# Patient Record
Sex: Male | Born: 1980 | Race: White | Hispanic: No | Marital: Married | Smoking: Current some day smoker
Health system: Southern US, Community
[De-identification: ages and names within clinical notes are randomized; demographics above are authoritative.]

## PROBLEM LIST (undated history)

## (undated) DIAGNOSIS — E785 Hyperlipidemia, unspecified: Secondary | ICD-10-CM

## (undated) DIAGNOSIS — K760 Fatty (change of) liver, not elsewhere classified: Secondary | ICD-10-CM

## (undated) DIAGNOSIS — R7303 Prediabetes: Secondary | ICD-10-CM

## (undated) HISTORY — DX: Fatty (change of) liver, not elsewhere classified: K76.0

## (undated) HISTORY — DX: Prediabetes: R73.03

---

## 2011-05-31 ENCOUNTER — Inpatient Hospital Stay (INDEPENDENT_AMBULATORY_CARE_PROVIDER_SITE_OTHER)
Admission: RE | Admit: 2011-05-31 | Discharge: 2011-05-31 | Disposition: A | Discharge status: Home / Self Care | Source: Ambulatory Visit | Attending: Emergency Medicine | Admitting: Emergency Medicine

## 2011-05-31 ENCOUNTER — Encounter: Payer: Self-pay | Admitting: Emergency Medicine

## 2011-05-31 DIAGNOSIS — J069 Acute upper respiratory infection, unspecified: Secondary | ICD-10-CM

## 2011-08-12 NOTE — Progress Notes (Signed)
Summary: COUGH W/ SINUS ISSUES, SOB...WSE (rm 3)   Vital Signs:  Patient Profile:   30 Years Old Male CC:      sinus pain/pressure, cough x 1 1/2 weeks Height:     77 inches Weight:      219 pounds O2 Sat:      97 % O2 treatment:    Room Air Temp:     98.6 degrees F oral Pulse rate:   53 / minute Resp:     12 per minute BP sitting:   109 / 67  (left arm) Cuff size:   regular  Vitals Entered By: Lajean Saver RN (May 31, 2011 12:43 PM)                  Updated Prior Medication List: No Medications Current Allergies: No known allergies History of Present Illness Chief Complaint: sinus pain/pressure, cough x 1 1/2 weeks History of Present Illness: 30 Years Old Male complains of onset of cold symptoms for 9 days.  Elija has been using Sudafed & Ibu which is helping a little bit.  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 No sore throat + cough No pleuritic pain No wheezing + nasal congestion + post-nasal drainage ++ L facial sinus pain/pressure No chest congestion No itchy/red eyes No earache No hemoptysis No SOB No chills/sweats No fever No nausea No vomiting No abdominal pain No diarrhea No skin rashes No fatigue No myalgias + headache   REVIEW OF SYSTEMS Constitutional Symptoms      Denies fever, chills, night sweats, weight loss, weight gain, and fatigue.  Eyes       Denies change in vision, eye pain, eye discharge, glasses, contact lenses, and eye surgery. Ear/Nose/Throat/Mouth       Complains of frequent runny nose and sinus problems.      Denies hearing loss/aids, change in hearing, ear pain, ear discharge, dizziness, frequent nose bleeds, sore throat, hoarseness, and tooth pain or bleeding.  Respiratory       Complains of productive cough.      Denies dry cough, wheezing, shortness of breath, asthma, bronchitis, and emphysema/COPD.  Cardiovascular       Denies murmurs,  chest pain, and tires easily with exhertion.    Gastrointestinal       Denies stomach pain, nausea/vomiting, diarrhea, constipation, blood in bowel movements, and indigestion. Genitourniary       Denies painful urination, kidney stones, and loss of urinary control. Neurological       Complains of headaches.      Denies paralysis, seizures, and fainting/blackouts. Musculoskeletal       Denies muscle pain, joint pain, joint stiffness, decreased range of motion, redness, swelling, muscle weakness, and gout.  Skin       Denies bruising, unusual mles/lumps or sores, and hair/skin or nail changes.  Psych       Denies mood changes, temper/anger issues, anxiety/stress, speech problems, depression, and sleep problems. Other Comments: Received flumist about 1 1/2 weeks ago, shortly afer developed productive cough, runny nose. Recently has developed severe sinus pain/HA on the left side.  Taken sudafed and Ibuprofen OTC   Past History:  Past Medical History: Unremarkable  Past Surgical History: Denies surgical history  Social History: Never Smoked Alcohol use-yes Drug use-no MilitarySmoking Status:  never Drug Use:  no Physical Exam General appearance: well developed, well nourished, no acute distress Head: maxillary sinus tenderness, Left Ears: normal,  no lesions or deformities Nasal: clear discharge Oral/Pharynx: clear PND, no erythema, no exudate Chest/Lungs: no rales, wheezes, or rhonchi bilateral, breath sounds equal without effort Heart: regular rate and  rhythm, no murmur MSE: oriented to time, place, and person Assessment New Problems: UPPER RESPIRATORY INFECTION, ACUTE (ICD-465.9)   Plan New Medications/Changes: AUGMENTIN 875-125 MG TABS (AMOXICILLIN-POT CLAVULANATE) 1 by mouth two times a day for 8 days  #16 x 0, 05/31/2011, Hoyt Koch MD  New Orders: New Patient Level II [phone] Planning Comments:   1)  Take the prescribed antibiotic as instructed. 2)  Use  nasal saline solution (over the counter) at least 3 times a day. 3)  Use over the counter decongestants like Zyrtec-D every 12 hours as needed to help with congestion. 4)  Can take tylenol every 6 hours or motrin every 8 hours for pain or fever. 5)  Follow up with your primary doctor  if no improvement in 5-7 days, sooner if increasing pain, fever, or new symptoms.    The patient and/or caregiver has been counseled thoroughly with regard to medications prescribed including dosage, schedule, interactions, rationale for use, and possible side effects and they verbalize understanding.  Diagnoses and expected course of recovery discussed and will return if not improved as expected or if the condition worsens. Patient and/or caregiver verbalized understanding.  Prescriptions: AUGMENTIN 875-125 MG TABS (AMOXICILLIN-POT CLAVULANATE) 1 by mouth two times a day for 8 days  #16 x 0   Entered and Authorized by:   Hoyt Koch MD   Signed by:   Hoyt Koch MD on 05/31/2011   Method used:   Print then Give to Patient   RxID:   [id number]   Orders Added: 1)  New Patient Level II [95621]

## 2012-06-20 ENCOUNTER — Emergency Department (INDEPENDENT_AMBULATORY_CARE_PROVIDER_SITE_OTHER)
Admission: EM | Admit: 2012-06-20 | Discharge: 2012-06-20 | Disposition: A | Discharge status: Home / Self Care | Source: Home / Self Care | Attending: Family Medicine | Admitting: Family Medicine

## 2012-06-20 DIAGNOSIS — J069 Acute upper respiratory infection, unspecified: Secondary | ICD-10-CM

## 2012-06-20 MED ORDER — BENZONATATE 200 MG PO CAPS: 200.0000 mg | ORAL_CAPSULE | Freq: Every day | ORAL | Status: DC

## 2012-06-20 MED ORDER — AZITHROMYCIN 250 MG PO TABS: ORAL_TABLET | ORAL | Status: DC

## 2012-06-20 NOTE — ED Provider Notes (Signed)
History     CSN: [id number]  Arrival date & time 06/20/12  1327   First MD Initiated Contact with Patient 06/20/12 1342      Chief Complaint  Patient presents with  . Cough    4 days     HPI Comments: Patient complains of approximately 4 day history of gradually progressive URI symptoms beginning with a mild sore throat (now improved), followed by progressive nasal congestion.  A cough started next.   Complains of fatigue but no myalgias.  Cough is now worse at night and generally non-productive during the day.  There has been no pleuritic pain, shortness of breath, or wheezes.   The history is provided by the patient.    History reviewed. No pertinent past medical history.  History reviewed. No pertinent past surgical history.  History reviewed. No pertinent family history.  History  Substance Use Topics  . Smoking status: Former Smoker -- 0.5 packs/day for 10 years  . Smokeless tobacco: Never Used  . Alcohol Use: Yes      Review of Systems + sore throat + cough No pleuritic pain No wheezing + nasal congestion + post-nasal drainage No sinus pain/pressure No itchy/red eyes No earache No hemoptysis No SOB No fever, + chills No nausea No vomiting No abdominal pain No diarrhea No urinary symptoms No skin rashes + fatigue No myalgias No headache Used OTC meds without relief  Allergies  Review of patient's allergies indicates no known allergies.  Home Medications   Current Outpatient Rx  Name Route Sig Dispense Refill  . PSEUDOEPHEDRINE-GUAIFENESIN ER 60-600 MG PO TB12 Oral Take 1 tablet by mouth every 12 (twelve) hours.    Marland Kitchen BENZONATATE 200 MG PO CAPS Oral Take 1 capsule (200 mg total) by mouth at bedtime. Take as needed for cough 12 capsule 0    BP 110/74  Pulse 70  Temp 98.2 F (36.8 C) (Oral)  Resp 16  Ht 6\' 3"  (1.905 m)  Wt 228 lb (103.42 kg)  BMI 28.50 kg/m2  SpO2 97%  Physical Exam Nursing notes and Vital Signs reviewed. Appearance:   Patient appears healthy, stated age, and in no acute distress Eyes:  Pupils are equal, round, and reactive to light and accomodation.  Extraocular movement is intact.  Conjunctivae are not inflamed  Ears:  Canals normal.  Tympanic membranes normal.  Nose:  Mildly congested turbinates.  No sinus tenderness.   Pharynx:  Normal Neck:  Supple.  Slightly tender shotty posterior nodes are palpated bilaterally  Lungs:  Clear to auscultation.  Breath sounds are equal.  Heart:  Regular rate and rhythm without murmurs, rubs, or gallops.  Abdomen:  Nontender without masses or hepatosplenomegaly.  Bowel sounds are present.  No CVA or flank tenderness.  Extremities:  No edema.  No calf tenderness Skin:  No rash present.   ED Course  Procedures none      1. Acute upper respiratory infections of unspecified site       MDM  There is no evidence of bacterial infection today.   Treat symptomatically for now:  Prescription written for Benzonatate [facility]) to take at bedtime for night-time cough.  Take Mucinex D (guaifenesin with decongestant) twice daily for congestion.  Increase fluid intake, rest. May use Afrin nasal spray (or generic oxymetazoline) twice daily for about 5 days.  Also recommend using saline nasal spray several times daily and saline nasal irrigation (AYR is a common brand) Stop all antihistamines for now, and other non-prescription cough/cold preparations.  May take Ibuprofen 200mg , 4 tabs every 8 hours with food for chest/sternum discomfort. Begin Azithromycin if not improving about 5 days or if persistent fever develops (Given a prescription to hold, with an expiration date)  Follow-up with family doctor if not improving 7 to 10 days.        Lattie Haw, MD 06/23/12 [phone]

## 2012-06-20 NOTE — ED Notes (Addendum)
Rembert complains of productive cough with green sputum for 4 days. He also complains of hoarseness and sore throat. Denies fever, chills or sweats.

## 2012-07-27 ENCOUNTER — Emergency Department (INDEPENDENT_AMBULATORY_CARE_PROVIDER_SITE_OTHER)
Admission: EM | Admit: 2012-07-27 | Discharge: 2012-07-27 | Disposition: A | Discharge status: Home / Self Care | Source: Home / Self Care

## 2012-07-27 ENCOUNTER — Encounter: Payer: Self-pay | Admitting: *Deleted

## 2012-07-27 DIAGNOSIS — S61219A Laceration without foreign body of unspecified finger without damage to nail, initial encounter: Secondary | ICD-10-CM

## 2012-07-27 DIAGNOSIS — S61209A Unspecified open wound of unspecified finger without damage to nail, initial encounter: Secondary | ICD-10-CM

## 2012-07-27 NOTE — ED Notes (Signed)
Patient reports he was sharpening his knife, his hand slipped causing a laceration to the base of his left index finger. Bleeding is slow. Site cleaned with Hibiclens.

## 2012-07-27 NOTE — ED Provider Notes (Signed)
History     CSN: [id number]  Arrival date & time 07/27/12  0907   First MD Initiated Contact with Patient 07/27/12 [phone]      No chief complaint on file.  HPI Comments: Pt accidentally cut his hand when sharpening his knife.   Patient is a 31 y.o. male presenting with skin laceration.  Laceration  The incident occurred less than 1 hour ago. The laceration is located on the left hand. The laceration is 2 cm in size. The laceration mechanism was a a metal edge. The pain is at a severity of 2/10. The pain is mild. He reports no foreign bodies present. His tetanus status is UTD.    History reviewed. No pertinent past medical history.  History reviewed. No pertinent past surgical history.  Family History  Problem Relation Age of Onset  . Cancer Father     prostate    History  Substance Use Topics  . Smoking status: Current Some Day Smoker -- 0.5 packs/day for 10 years  . Smokeless tobacco: Never Used  . Alcohol Use: Yes      Review of Systems  Allergies  Review of patient's allergies indicates no known allergies.  Home Medications   Current Outpatient Rx  Name  Route  Sig  Dispense  Refill  . AZITHROMYCIN 250 MG PO TABS      Take 2 tabs today; then begin one tab once daily for 4 more days. (Rx void after 06/28/12)   6 each   0   . BENZONATATE 200 MG PO CAPS   Oral   Take 1 capsule (200 mg total) by mouth at bedtime. Take as needed for cough   12 capsule   0   . PSEUDOEPHEDRINE-GUAIFENESIN ER 60-600 MG PO TB12   Oral   Take 1 tablet by mouth every 12 (twelve) hours.           BP 115/77  Pulse 59  Temp 98.2 F (36.8 C) (Oral)  Resp 14  Ht 6\' 5"  (1.956 m)  Wt 220 lb (99.791 kg)  BMI 26.09 kg/m2  SpO2 96%  Physical Exam  Constitutional: He appears well-developed and well-nourished.  HENT:  Head: Normocephalic and atraumatic.  Right Ear: External ear normal.  Left Ear: External ear normal.  Eyes: Conjunctivae normal are normal. Pupils are equal,  round, and reactive to light.  Neck: Normal range of motion. Neck supple.  Cardiovascular: Normal rate, regular rhythm and normal heart sounds.   Pulmonary/Chest: Effort normal and breath sounds normal.  Abdominal: Soft. Bowel sounds are normal.  Musculoskeletal:       Hands:      Superficial hand laceration, full ROM, neurovascularly intact distally   Neurological: He is alert.  Skin: Skin is warm.    ED Course  LACERATION REPAIR Performed by: Doree Albee Authorized by: Doree Albee Consent: Verbal consent obtained. Risks and benefits: risks, benefits and alternatives were discussed Body area: upper extremity Location details: left index finger Laceration length: 2 cm Foreign bodies: no foreign bodies Tendon involvement: none Nerve involvement: none Vascular damage: no Local anesthetic: lidocaine 2% without epinephrine Anesthetic total: 4.5 ml Preparation: Patient was prepped and draped in the usual sterile fashion. Irrigation solution: saline Amount of cleaning: standard Debridement: minimal Degree of undermining: none Skin closure: Ethilon Number of sutures: 5 Suture technique: interrupted  Dressing: 4x4 sterile gauze Patient tolerance: Patient tolerated the procedure well with no immediate complications.   (including critical care time)  Labs Reviewed - No data  to display No results found.   1. Finger laceration       MDM  Area copiously cleansed and irrigated at bedside.  Sutures placed.  Discussed infectious red flags.  Follow up in 5-7 days for suture removal.     The patient and/or caregiver has been counseled thoroughly with regard to treatment plan and/or medications prescribed including dosage, schedule, interactions, rationale for use, and possible side effects and they verbalize understanding. Diagnoses and expected course of recovery discussed and will return if not improved as expected or if the condition worsens. Patient and/or caregiver  verbalized understanding.             Doree Albee, MD 07/27/12 1032

## 2012-07-30 ENCOUNTER — Telehealth: Payer: Self-pay | Admitting: *Deleted

## 2012-10-31 ENCOUNTER — Encounter: Payer: Self-pay | Admitting: *Deleted

## 2012-10-31 ENCOUNTER — Emergency Department (INDEPENDENT_AMBULATORY_CARE_PROVIDER_SITE_OTHER)
Admission: EM | Admit: 2012-10-31 | Discharge: 2012-10-31 | Disposition: A | Discharge status: Home / Self Care | Source: Home / Self Care | Attending: Family Medicine | Admitting: Family Medicine

## 2012-10-31 DIAGNOSIS — J069 Acute upper respiratory infection, unspecified: Secondary | ICD-10-CM

## 2012-10-31 MED ORDER — AZITHROMYCIN 250 MG PO TABS: ORAL_TABLET | ORAL | Status: DC

## 2012-10-31 MED ORDER — BENZONATATE 200 MG PO CAPS: 200.0000 mg | ORAL_CAPSULE | Freq: Every day | ORAL | Status: DC

## 2012-10-31 NOTE — ED Provider Notes (Signed)
History     CSN: [id number]  Arrival date & time 10/31/12  1343   First MD Initiated Contact with Patient 10/31/12 1401      Chief Complaint  Patient presents with  . Nasal Congestion  . Cough  . Headache       HPI Comments: Pt complains of headache, rhinitis, and cough for 3 days. Has tried OTC Mucinex D with little relief.    The history is provided by the patient.    History reviewed. No pertinent past medical history.  History reviewed. No pertinent past surgical history.  Family History  Problem Relation Age of Onset  . Cancer Father     prostate    History  Substance Use Topics  . Smoking status: Current Some Day Smoker -- 0.50 packs/day for 10 years  . Smokeless tobacco: Never Used  . Alcohol Use: Yes      Review of Systems + sore throat + cough No pleuritic pain No wheezing + nasal congestion + post-nasal drainage No sinus pain/pressure No itchy/red eyes No earache No hemoptysis No SOB No fever/chills No nausea No vomiting No abdominal pain No diarrhea No urinary symptoms No skin rashes + fatigue No myalgias + headache Used OTC meds without relief  Allergies  Review of patient's allergies indicates no known allergies.  Home Medications   Current Outpatient Rx  Name  Route  Sig  Dispense  Refill  . azithromycin (ZITHROMAX Z-PAK) 250 MG tablet      Take 2 tabs today; then begin one tab once daily for 4 more days. (Rx void after 11/08/12)   6 each   0   . benzonatate (TESSALON) 200 MG capsule   Oral   Take 1 capsule (200 mg total) by mouth at bedtime. Take as needed for cough   12 capsule   0   . pseudoephedrine-guaifenesin (MUCINEX D) 60-600 MG per tablet   Oral   Take 1 tablet by mouth every 12 (twelve) hours.           BP 126/78  Pulse 85  Temp(Src) 97.6 F (36.4 C) (Oral)  Resp 16  Ht 6\' 5"  (1.956 m)  Wt 234 lb 8 oz (106.369 kg)  BMI 27.8 kg/m2  SpO2 97%  Physical Exam Nursing notes and Vital Signs  reviewed. Appearance:  Patient appears healthy, stated age, and in no acute distress Eyes:  Pupils are equal, round, and reactive to light and accomodation.  Extraocular movement is intact.  Conjunctivae are not inflamed  Ears:  Canals normal.  Tympanic membranes normal.  Nose:  Mildly congested turbinates.  No sinus tenderness.   Pharynx:  Normal Neck:  Supple.  Slightly tender shotty posterior nodes are palpated bilaterally  Lungs:  Clear to auscultation.  Breath sounds are equal.  Heart:  Regular rate and rhythm without murmurs, rubs, or gallops.  Abdomen:  Nontender without masses or hepatosplenomegaly.  Bowel sounds are present.  No CVA or flank tenderness.  Extremities:  No edema.  No calf tenderness Skin:  No rash present.   ED Course  Procedures  none      1. Acute upper respiratory infections of unspecified site; viral URI       MDM  There is no evidence of bacterial infection today.   Treat symptomatically for now  Prescription written for Benzonatate (Tessalon) to take at bedtime for night-time cough.  Take Mucinex D (guaifenesin with decongestant) twice daily for congestion.  Increase fluid intake, rest. May use Afrin  nasal spray (or generic oxymetazoline) twice daily for about 5 days.  Also recommend using saline nasal spray several times daily and saline nasal irrigation (AYR is a common brand) Stop all antihistamines for now, and other non-prescription cough/cold preparations. Begin Azithromycin if not improving about 5 days or if persistent fever develops (Given a prescription to hold, with an expiration date)  Follow-up with family doctor if not improving about 10 days.        Lattie Haw, MD 10/31/12 [phone]

## 2012-10-31 NOTE — ED Notes (Signed)
Pt c/o headache, rhinitis, cough x 3 days. Has tried OTC Mucinex d with little relief.

## 2013-08-30 ENCOUNTER — Encounter: Payer: Self-pay | Admitting: Emergency Medicine

## 2013-08-30 ENCOUNTER — Emergency Department (INDEPENDENT_AMBULATORY_CARE_PROVIDER_SITE_OTHER)
Admission: EM | Admit: 2013-08-30 | Discharge: 2013-08-30 | Disposition: A | Discharge status: Home / Self Care | Source: Home / Self Care | Attending: Family Medicine | Admitting: Family Medicine

## 2013-08-30 DIAGNOSIS — J069 Acute upper respiratory infection, unspecified: Secondary | ICD-10-CM

## 2013-08-30 MED ORDER — BENZONATATE 200 MG PO CAPS: 200.0000 mg | ORAL_CAPSULE | Freq: Every day | ORAL | Status: DC

## 2013-08-30 MED ORDER — AZITHROMYCIN 250 MG PO TABS: ORAL_TABLET | ORAL | Status: DC

## 2013-08-30 MED ORDER — PSEUDOEPHEDRINE-GUAIFENESIN ER 120-1200 MG PO TB12: 1.0000 | ORAL_TABLET | Freq: Two times a day (BID) | ORAL | Status: DC

## 2013-08-30 NOTE — ED Notes (Signed)
Pt c/o productive cough x 10 days. Denies fever.

## 2013-08-30 NOTE — ED Provider Notes (Signed)
CSN: [id number]     Arrival date & time 08/30/13  1947 History   First MD Initiated Contact with Patient 08/30/13 1955     Chief Complaint  Patient presents with  . Cough      HPI Comments: Patient complains of onset of scratchy throat and sinus congestion about 1.5 weeks ago, followed by a cough about 3 days later.  His cough is generally productive.  He has felt slightly dizzy at times over the past 2 days, but denies fevers, chills, and sweats.  No earache.  The history is provided by the patient.    History reviewed. No pertinent past medical history. History reviewed. No pertinent past surgical history. Family History  Problem Relation Age of Onset  . Cancer Father     prostate   History  Substance Use Topics  . Smoking status: Former Smoker -- 0.50 packs/day for 10 years  . Smokeless tobacco: Never Used  . Alcohol Use: Yes    Review of Systems + sore throat, resolved + cough No pleuritic pain No wheezing + nasal congestion + post-nasal drainage No sinus pain/pressure No itchy/red eyes No earache No hemoptysis No SOB No fever, + chills No nausea No vomiting No abdominal pain No diarrhea No urinary symptoms No skin rash + fatigue No myalgias No headache Used OTC meds without relief  Allergies  Review of patient's allergies indicates no known allergies.  Home Medications   Current Outpatient Rx  Name  Route  Sig  Dispense  Refill  . azithromycin (ZITHROMAX Z-PAK) 250 MG tablet      Take 2 tabs today; then begin one tab once daily for 4 more days. (Rx void after 09/07/13)   6 each   0   . benzonatate (TESSALON) 200 MG capsule   Oral   Take 1 capsule (200 mg total) by mouth at bedtime. Take as needed for cough   12 capsule   0   . Pseudoephedrine-Guaifenesin [id number] MG TB12   Oral   Take 1 tablet by mouth 2 (two) times daily.   20 each   1    BP 121/77  Pulse 72  Temp(Src) 97.8 F (36.6 C) (Oral)  Resp 18  Ht 6\' 5"  (1.956 m)  Wt 234  lb (106.142 kg)  BMI 27.74 kg/m2  SpO2 97% Physical Exam Nursing notes and Vital Signs reviewed. Appearance:  Patient appears healthy, stated age, and in no acute distress Eyes:  Pupils are equal, round, and reactive to light and accomodation.  Extraocular movement is intact.  Conjunctivae are not inflamed  Ears:  Canals normal.  Tympanic membranes normal.  Nose:  Mildly congested turbinates.  No sinus tenderness.   Pharynx:  Normal Neck:  Supple.  Nontender shotty posterior nodes are palpated bilaterally  Lungs:  Clear to auscultation.  Breath sounds are equal.  Heart:  Regular rate and rhythm without murmurs, rubs, or gallops.  Abdomen:  Nontender without masses or hepatosplenomegaly.  Bowel sounds are present.  No CVA or flank tenderness.  Extremities:  No edema.  No calf tenderness Skin:  No rash present.   ED Course  Procedures  none      MDM   1. Acute upper respiratory infections of unspecified site; suspect viral URI     There is no evidence of bacterial infection today.  Treat symptomatically for now: Prescription written for Benzonatate [facility]) to take at bedtime for night-time cough.  Take Mucinex D (1200mg  guaifenesin with decongestant) twice daily for congestion.  Increase fluid intake, rest. May use Afrin nasal spray (or generic oxymetazoline) twice daily for about 5 days.  Also recommend using saline nasal spray several times daily and saline nasal irrigation (AYR is a common brand) Try warm salt water gargles for sore throat.  Stop all antihistamines for now, and other non-prescription cough/cold preparations. Begin Azithromycin if not improving about one week or if persistent fever develops (Given a prescription to hold, with an expiration date)  Follow-up with family doctor if not improving about10 days.     Lattie Haw, MD 08/30/13 2019

## 2013-09-21 ENCOUNTER — Emergency Department (INDEPENDENT_AMBULATORY_CARE_PROVIDER_SITE_OTHER)
Admission: EM | Admit: 2013-09-21 | Discharge: 2013-09-21 | Disposition: A | Discharge status: Home / Self Care | Source: Home / Self Care | Attending: Emergency Medicine | Admitting: Emergency Medicine

## 2013-09-21 ENCOUNTER — Encounter: Payer: Self-pay | Admitting: Emergency Medicine

## 2013-09-21 DIAGNOSIS — J209 Acute bronchitis, unspecified: Secondary | ICD-10-CM

## 2013-09-21 MED ORDER — PROMETHAZINE-CODEINE 6.25-10 MG/5ML PO SYRP: ORAL_SOLUTION | ORAL | Status: DC

## 2013-09-21 MED ORDER — AZITHROMYCIN 250 MG PO TABS: ORAL_TABLET | ORAL | Status: DC

## 2013-09-21 MED ORDER — PREDNISONE (PAK) 10 MG PO TABS: ORAL_TABLET | ORAL | Status: DC

## 2013-09-21 NOTE — ED Notes (Signed)
Shayne c/o persistent cough, productive at times, green, without fever or SOB. Current smoker. Received flu vac this season.

## 2013-09-21 NOTE — ED Provider Notes (Signed)
CSN: [id number]     Arrival date & time 09/21/13  0909 History   First MD Initiated Contact with Patient 09/21/13 0913     Chief Complaint  Patient presents with  . Cough   (Consider location/radiation/quality/duration/timing/severity/associated sxs/prior Treatment) HPI Robert [name] c/o persistent cough, productive at times, green, without fever or SOB. Current smoker. Received flu vac this season.   Was seen here 08/30/13,  URI, and was also given a prescription for Z-Pak by physician here, which he did fill and completed    However, cough and congestion improved somewhat but never went away and now 3 days of worsening symptoms  URI HISTORY  Robert [name] is a 33 y.o. male who complains of onset of cold symptoms for since 08/30/13, worse x3 days   No chills/sweats +  Low-grade Fever,   No Nasal congestion No Discolored Post-nasal drainage No sinus pain/pressure No sore throat  +  Cough, which is hacking and persistent,  productive of green sputum No wheezing +chest congestion No hemoptysis No shortness of breath No pleuritic pain  No itchy/red eyes No earache  No nausea No vomiting No abdominal pain No diarrhea  No skin rashes +  Fatigue No myalgias No headache   History reviewed. No pertinent past medical history. History reviewed. No pertinent past surgical history. Family History  Problem Relation Age of Onset  . Cancer Father     prostate   History  Substance Use Topics  . Smoking status: Current Every Day Smoker -- 0.50 packs/day for 10 years  . Smokeless tobacco: Never Used  . Alcohol Use: Yes    Review of Systems  All other systems reviewed and are negative.    Allergies  Review of patient's allergies indicates no known allergies.  Home Medications   Current Outpatient Rx  Name  Route  Sig  Dispense  Refill  . azithromycin (ZITHROMAX Z-PAK) 250 MG tablet      Take 2 tablets on day one, then 1 tablet daily on days 2 through 5   1 each   0   .  predniSONE (STERAPRED UNI-PAK) 10 MG tablet      Take as directed for 6 days.--Take 6 on day 1, 5 on day 2, 4 on day 3, then 3 tablets on day 4, then 2 tablets on day 5, then 1 on day 6.   21 tablet   0   . promethazine-codeine (PHENERGAN WITH CODEINE) 6.25-10 MG/5ML syrup      Take 1-2 teaspoons every 4-6 hours as needed for cough. May cause drowsiness.   120 mL   0    BP 119/81  Pulse 79  Temp(Src) 97.7 F (36.5 C) (Oral)  Resp 16  Wt 235 lb (106.595 kg)  SpO2 98% Physical Exam  Nursing note and vitals reviewed. Constitutional: He is oriented to person, place, and time. He appears well-developed and well-nourished.  Non-toxic appearance. No distress.  Cough noted  HENT:  Head: Normocephalic and atraumatic.  Right Ear: Tympanic membrane normal.  Left Ear: Tympanic membrane normal.  Nose: Nose normal.  Mouth/Throat: Oropharynx is clear and moist. No oropharyngeal exudate.  Eyes: Right eye exhibits no discharge. Left eye exhibits no discharge. No scleral icterus.  Neck: Neck supple.  Cardiovascular: Normal rate, regular rhythm and normal heart sounds.   Pulmonary/Chest: No respiratory distress. He has no decreased breath sounds. He has rhonchi. He has no rales.  A few anterior rhonchi. Upon forced expiration, slight wheeze heard anteriorly, no other wheezes heard.  Hacking cough noted  Lymphadenopathy:    He has no cervical adenopathy.  Neurological: He is alert and oriented to person, place, and time.  Skin: Skin is warm and dry.    ED Course  Procedures (including critical care time) Labs Review Labs Reviewed - No data to display Imaging Review No results found.  EKG Interpretation    Date/Time:    Ventricular Rate:    PR Interval:    QRS Duration:   QT Interval:    QTC Calculation:   R Axis:     Text Interpretation:              MDM   1. Acute bronchitis with bronchospasm    Treatment options discussed, as well as risks, benefits,  alternatives. Patient voiced understanding and agreement with the following plans:  Z-Pak Prednisone burst Promethazine with codeine cough syrup Other symptomatic care Follow-up with your primary care doctor in 5-7 days if not improving, or sooner if symptoms become worse.  Precautions discussed. Red flags discussed. Questions invited and answered. Patient voiced understanding and agreement.     Jacqulyn Cane, MD 09/21/13 [phone]

## 2013-11-10 ENCOUNTER — Emergency Department (INDEPENDENT_AMBULATORY_CARE_PROVIDER_SITE_OTHER)
Admission: EM | Admit: 2013-11-10 | Discharge: 2013-11-10 | Disposition: A | Discharge status: Home / Self Care | Source: Home / Self Care | Attending: Family Medicine | Admitting: Family Medicine

## 2013-11-10 ENCOUNTER — Encounter: Payer: Self-pay | Admitting: Emergency Medicine

## 2013-11-10 DIAGNOSIS — J069 Acute upper respiratory infection, unspecified: Secondary | ICD-10-CM

## 2013-11-10 DIAGNOSIS — J029 Acute pharyngitis, unspecified: Secondary | ICD-10-CM

## 2013-11-10 LAB — POCT RAPID STREP A (OFFICE): RAPID STREP A SCREEN: NEGATIVE

## 2013-11-10 MED ORDER — AZITHROMYCIN 250 MG PO TABS: ORAL_TABLET | ORAL | Status: DC

## 2013-11-10 MED ORDER — BENZONATATE 200 MG PO CAPS: 200.0000 mg | ORAL_CAPSULE | Freq: Every day | ORAL | Status: DC

## 2013-11-10 MED ORDER — PREDNISONE 20 MG PO TABS: 20.0000 mg | ORAL_TABLET | Freq: Two times a day (BID) | ORAL | Status: DC

## 2013-11-10 NOTE — Discharge Instructions (Signed)
Take plain Mucinex (1200 mg guaifenesin) twice daily for cough and congestion.  May add Sudafed for sinus congestion.   Increase fluid intake, rest. May use Afrin nasal spray (or generic oxymetazoline) twice daily for about 5 days.  Also recommend using saline nasal spray several times daily and saline nasal irrigation (AYR is a common brand) Try warm salt water gargles for sore throat.  Stop all antihistamines for now, and other non-prescription cough/cold preparations. May take Tylenol for fever, headache, etc. Begin Azithromycin if not improving about 5 days or if persistent fever develops   Follow-up with family doctor if not improving 7 to 10 days.

## 2013-11-10 NOTE — ED Notes (Signed)
Robert [name] c/o sore throat, sinus burning and congestion without fever x 5 days. Rec'd flu vac this season.

## 2013-11-10 NOTE — ED Provider Notes (Signed)
CSN: [id number]     Arrival date & time 11/10/13  1424 History   First MD Initiated Contact with Patient 11/10/13 [phone]     Chief Complaint  Patient presents with  . Sore Throat  . Facial Pain      HPI Comments: Patient developed a very mild sore throat four days ago.  Two days ago he developed sinus congestion and a mild cough  The history is provided by the patient.    History reviewed. No pertinent past medical history. History reviewed. No pertinent past surgical history. Family History  Problem Relation Age of Onset  . Cancer Father     prostate   History  Substance Use Topics  . Smoking status: Current Every Day Smoker -- 0.50 packs/day for 10 years  . Smokeless tobacco: Never Used  . Alcohol Use: Yes    Review of Systems + sore throat + cough No pleuritic pain No wheezing + nasal congestion No post-nasal drainage No sinus pain/pressure No itchy/red eyes No earache No hemoptysis No SOB No fever/chills No nausea No vomiting No abdominal pain No diarrhea No urinary symptoms No skin rash No fatigue No myalgias No headache Used OTC meds without relief  Allergies  Review of patient's allergies indicates no known allergies.  Home Medications   Current Outpatient Rx  Name  Route  Sig  Dispense  Refill  . azithromycin (ZITHROMAX Z-PAK) 250 MG tablet      Take 2 tabs today; then begin one tab once daily for 4 more days. (Rx void after 11/18/13)   6 each   0   . benzonatate (TESSALON) 200 MG capsule   Oral   Take 1 capsule (200 mg total) by mouth at bedtime. Take as needed for cough   12 capsule   0   . predniSONE (DELTASONE) 20 MG tablet   Oral   Take 1 tablet (20 mg total) by mouth 2 (two) times daily. Take with food.   10 tablet   0    BP 111/73  Pulse 89  Temp(Src) 98 F (36.7 C) (Oral)  Resp 16  Wt 229 lb (103.874 kg)  SpO2 98% Physical Exam Nursing notes and Vital Signs reviewed. Appearance:  Patient appears stated age, and in no  acute distress Eyes:  Pupils are equal, round, and reactive to light and accomodation.  Extraocular movement is intact.  Conjunctivae are not inflamed  Ears:  Canals normal.  Tympanic membranes normal.  Nose:  Mildly congested turbinates.  No sinus tenderness.   Pharynx:   Minimal erythema Neck:  Supple.  Shotty non-tender posterior nodes are palpated bilaterally  Lungs:  Clear to auscultation.  Breath sounds are equal.  Heart:  Regular rate and rhythm without murmurs, rubs, or gallops.  Abdomen:  Nontender without masses or hepatosplenomegaly.  Bowel sounds are present.  No CVA or flank tenderness.  Extremities:  No edema.  No calf tenderness Skin:  No rash present.   ED Course  Procedures  none    Labs Reviewed  STREP A DNA PROBE  POCT RAPID STREP A (OFFICE) negative        MDM   1. Acute pharyngitis   2. Acute upper respiratory infections of unspecified site; suspect viral URI    Throat culture pending There is no evidence of bacterial infection today.  Treat symptomatically for now  Prescription written for Benzonatate (Tessalon) to take at bedtime for night-time cough.  Take plain Mucinex (1200 mg guaifenesin) twice daily for cough  and congestion.  May add Sudafed for sinus congestion.   Increase fluid intake, rest. May use Afrin nasal spray (or generic oxymetazoline) twice daily for about 5 days.  Also recommend using saline nasal spray several times daily and saline nasal irrigation (AYR is a common brand) Try warm salt water gargles for sore throat.  Stop all antihistamines for now, and other non-prescription cough/cold preparations. May take Tylenol for fever, headache, etc. Begin Azithromycin if not improving about 5 days or if persistent fever develops (Given a prescription to hold, with an expiration date)  Follow-up with family doctor if not improving 7 to 10 days.     Kandra Nicolas, MD 11/11/13 [phone]

## 2013-11-11 LAB — STREP A DNA PROBE: GASP: NEGATIVE

## 2014-01-27 ENCOUNTER — Encounter: Payer: Self-pay | Admitting: Emergency Medicine

## 2014-01-27 ENCOUNTER — Emergency Department (INDEPENDENT_AMBULATORY_CARE_PROVIDER_SITE_OTHER)

## 2014-01-27 ENCOUNTER — Emergency Department (INDEPENDENT_AMBULATORY_CARE_PROVIDER_SITE_OTHER)
Admission: EM | Admit: 2014-01-27 | Discharge: 2014-01-27 | Disposition: A | Discharge status: Home / Self Care | Source: Home / Self Care | Attending: Emergency Medicine | Admitting: Emergency Medicine

## 2014-01-27 DIAGNOSIS — R0789 Other chest pain: Secondary | ICD-10-CM

## 2014-01-27 DIAGNOSIS — R079 Chest pain, unspecified: Secondary | ICD-10-CM

## 2014-01-27 DIAGNOSIS — Z87891 Personal history of nicotine dependence: Secondary | ICD-10-CM

## 2014-01-27 DIAGNOSIS — J449 Chronic obstructive pulmonary disease, unspecified: Secondary | ICD-10-CM

## 2014-01-27 DIAGNOSIS — J4489 Other specified chronic obstructive pulmonary disease: Secondary | ICD-10-CM

## 2014-01-27 NOTE — ED Provider Notes (Signed)
CSN: [id number]     Arrival date & time 01/27/14  1136 History   First MD Initiated Contact with Patient 01/27/14 1147     Chief Complaint  Patient presents with  . Chest Pain  Robert [name] c/o left sided CP intermittent x 2 and 1/2 weeks. Denies any associated symptoms when CP occurs, such as numbness, SOB, HA, diaphoresis, jaw pain. Denis any current pain. Denies recent illness or strenuous activity. Negative for family hx of heart disease.   Patient is a 33 y.o. male presenting with chest pain. The history is provided by the patient.  Chest Pain Pain location:  L chest, L lateral chest and R lateral chest Pain quality: dull   Pain radiates to:  Does not radiate Pain radiates to the back: no   Pain severity now: Usually has 3/10- CURRENTLY, NO CHEST PAIN OR SXS. Onset quality:  Unable to specify (Only occurs at rest. He never gets chest pain when he does his usual 2 mile run) Duration:  5 minutes (Then resolves spontaneously) Timing:  Intermittent (Only gets this at rest) Progression:  Waxing and waning Chronicity:  New (Then, he mentions he complained of this when he went to the [facility] in January, EKG was normal) Context: at rest and stress (increased stress at work. He works as a Designer, industrial/product for [employer])   Context: not breathing, not eating, not lifting, no movement and not raising an arm   Relieved by: Nothing, except resolves on its own after about 5 minutes. Worsened by:  Nothing tried Associated symptoms: anxiety and headache ( occasional headache in the past, immediately resolved after drinking  cup of coffee)   Associated symptoms: no abdominal pain, no altered mental status, no anorexia, no back pain, no claudication, no cough, no dizziness, no dysphagia, no fever, no heartburn, no lower extremity edema, no nausea, no numbness, no orthopnea, no palpitations, no PND, no shortness of breath, no syncope, not vomiting and no weakness   Risk  factors: smoking   Risk factors: no aortic disease, no coronary artery disease, no diabetes mellitus, no high cholesterol, no hypertension, not obese, no prior DVT/PE and no surgery    Denies any reflux symptoms. No belching. No dysphasia.  History reviewed. No pertinent past medical history. History reviewed. No pertinent past surgical history. Family History  Problem Relation Age of Onset  . Cancer Father     prostate   History  Substance Use Topics  . Smoking status: Current Every Day Smoker -- 0.50 packs/day for 10 years  . Smokeless tobacco: Never Used  . Alcohol Use: Yes    Review of Systems  Constitutional: Negative for fever.  HENT: Negative for trouble swallowing.   Respiratory: Negative for cough and shortness of breath.   Cardiovascular: Positive for chest pain. Negative for palpitations, orthopnea, claudication, syncope and PND.  Gastrointestinal: Negative for heartburn, nausea, vomiting, abdominal pain and anorexia.  Musculoskeletal: Negative for back pain.  Neurological: Positive for headaches ( occasional headache in the past, immediately resolved after drinking  cup of coffee). Negative for dizziness, weakness and numbness.    Allergies  Review of patient's allergies indicates no known allergies.  Home Medications    BP 126/76  Pulse 64  Temp(Src) 98 F (36.7 C) (Oral)  Resp 14  Wt 233 lb (105.688 kg)  SpO2 98% Physical Exam  Nursing note and vitals reviewed. Constitutional: He is oriented to person, place, and time. He appears well-developed and well-nourished. No distress.  HENT:  Head: Normocephalic and atraumatic.  Nose: Nose normal.  Mouth/Throat: Oropharynx is clear and moist.  Eyes: Conjunctivae and EOM are normal. Pupils are equal, round, and reactive to light. No scleral icterus.  Neck: Normal range of motion. Neck supple. No JVD present. No tracheal deviation present. No thyromegaly present.  Cardiovascular: Normal rate, regular rhythm,  normal heart sounds and intact distal pulses.  Exam reveals no gallop and no friction rub.   No murmur heard. Pulmonary/Chest: Effort normal and breath sounds normal. No respiratory distress. He has no wheezes. He has no rales. He exhibits no tenderness.  Abdominal: Soft. He exhibits no distension. There is no tenderness.  Musculoskeletal: Normal range of motion. He exhibits no edema and no tenderness.  Lymphadenopathy:    He has no cervical adenopathy.  Neurological: He is alert and oriented to person, place, and time. No cranial nerve deficit. He exhibits normal muscle tone.  Skin: Skin is warm. No rash noted. He is not diaphoretic.  Psychiatric: He has a normal mood and affect.    ED Course  ED EKG  Date/Time: 01/27/2014 12:22 PM Performed by: Burnett Harry, Hanna Authorized by: Burnett Harry, Michell Previous ECG: no previous ECG available Rhythm comments: Sinus bradycardia, rate 56.--- Manual pulse 64 Ectopy comments: None QRS axis: right (Normal for age and body habitus) Conduction: conduction normal ST Segments: ST segments normal T Waves: T waves normal Other findings: early repolarization Other findings comments: Benign early repolarization in inferior leads, EKG immediately repeated and early repolarization not present with repeat EKG Clinical impression: normal ECG   (including critical care time) Labs Review Labs Reviewed - No data to display  Imaging Review Dg Chest 2 View  01/27/2014   CLINICAL DATA:  Left-sided upper chest pain, smoking history  EXAM: CHEST  2 VIEW  COMPARISON:  None.  FINDINGS: Hyperinflation, mild. Heart size and vascular pattern normal. Lungs clear. No effusion or pneumothorax. Osseous thorax intact.  IMPRESSION: Mild COPD.  No acute abnormalities.   Electronically Signed   By: Skipper Cliche M.D.   On: 01/27/2014 13:15     MDM   1. Atypical chest pain   2. Chest pain    EKG within normal limits.--See details above. Chest x-ray reviewed by me. No acute  abnormalities seen.  Currently not having chest pain. No evidence of any cardiorespiratory cause. Of note, symptoms are only at rest and never with exertion, and he runs 2 miles regularly without chest pain. No symptoms suggestive of esophageal/GI cause. Stress may be a factor.  We discussed options at length. Advised to quit smoking.  Treatment options discussed, as well as risks, benefits, alternatives. Patient voiced understanding and agreement with the following plans: No treatment at the present time, as he is asymptomatic. At the end of the visit, he tells me that he has an appointment at the [facility] on 02/02/2014 to evaluate with a stress test. I advised him to go ahead and keep that appointment.  Precautions discussed. Red flags discussed.--- Go to emergency room if any red flags Questions invited and answered. Patient voiced understanding and agreement.    Jacqulyn Cane, MD 01/27/14 [phone]

## 2014-01-27 NOTE — ED Notes (Signed)
Robert [name] c/o left sided CP intermittent x 2/1/2 weeks. Denies any associated symptoms when CP occurs, such as numbness, SOB, HA, diaphoresis, jaw pain. Denis any current pain. Denies recent illness or strenuous activity. Negative for family hx of heart disease.

## 2014-08-06 ENCOUNTER — Emergency Department (INDEPENDENT_AMBULATORY_CARE_PROVIDER_SITE_OTHER)
Admission: EM | Admit: 2014-08-06 | Discharge: 2014-08-06 | Disposition: A | Discharge status: Home / Self Care | Source: Home / Self Care | Attending: Emergency Medicine | Admitting: Emergency Medicine

## 2014-08-06 DIAGNOSIS — R1030 Lower abdominal pain, unspecified: Secondary | ICD-10-CM

## 2014-08-06 LAB — POCT CBC W AUTO DIFF (K'VILLE URGENT CARE)

## 2014-08-06 LAB — POCT URINALYSIS DIP (MANUAL ENTRY)
BILIRUBIN UA: NEGATIVE
Bilirubin, UA: NEGATIVE
GLUCOSE UA: NEGATIVE
Leukocytes, UA: NEGATIVE
Nitrite, UA: NEGATIVE
Protein Ur, POC: NEGATIVE
RBC UA: NEGATIVE
UROBILINOGEN UA: 0.2
pH, UA: 5.5

## 2014-08-06 NOTE — ED Notes (Signed)
Patient c/o Nausea and Vomiting, abdominal pain, lower back pain , denies fever and diarrhea, sx started 2-3 days ago

## 2014-08-06 NOTE — Discharge Instructions (Signed)

## 2014-08-06 NOTE — ED Provider Notes (Signed)
CSN: [id number]     Arrival date & time 08/06/14  1239 History   First MD Initiated Contact with Patient 08/06/14 1320     Chief Complaint  Patient presents with  . Abdominal Pain   (Consider location/radiation/quality/duration/timing/severity/associated sxs/prior Treatment) Patient is a 33 y.o. male presenting with abdominal pain. The history is provided by the patient. No language interpreter was used.  Abdominal Pain Pain location:  Generalized Pain quality: aching   Pain radiates to:  Does not radiate Pain severity:  Moderate Onset quality:  Gradual Duration:  3 days Timing:  Constant Progression:  Worsening Chronicity:  New Context: recent illness   Relieved by:  Nothing Worsened by:  Nothing tried Ineffective treatments:  None tried Associated symptoms: nausea and vomiting   Associated symptoms: no dysuria and no hematuria   Risk factors: has not had multiple surgeries   Pt reports he began feeling sick Thursday, nauseated yesterday and vomitted,  Uncomfortable and could not sleep last pm.  Pt reports decreased appetite today.   No past medical history on file. No past surgical history on file. Family History  Problem Relation Age of Onset  . Cancer Father     prostate   History  Substance Use Topics  . Smoking status: Current Every Day Smoker -- 0.50 packs/day for 10 years  . Smokeless tobacco: Never Used  . Alcohol Use: Yes    Review of Systems  Gastrointestinal: Positive for nausea, vomiting and abdominal pain.  Genitourinary: Negative for dysuria and hematuria.  All other systems reviewed and are negative.   Allergies  Review of patient's allergies indicates no known allergies.  Home Medications   Prior to Admission medications   Not on File   BP 122/79 mmHg  Pulse 65  Temp(Src) 97.6 F (36.4 C) (Oral)  Ht 6\' 5"  (1.956 m)  Wt 217 lb (98.431 kg)  BMI 25.73 kg/m2  SpO2 98% Physical Exam  Constitutional: He is oriented to person, place, and  time. He appears well-developed and well-nourished.  HENT:  Head: Normocephalic and atraumatic.  Eyes: EOM are normal. Pupils are equal, round, and reactive to light.  Neck: Normal range of motion.  Cardiovascular: Normal rate and normal heart sounds.   Pulmonary/Chest: Effort normal.  Abdominal: Soft. He exhibits no distension.  Musculoskeletal: Normal range of motion.  Neurological: He is alert and oriented to person, place, and time.  Skin: Skin is warm.  Psychiatric: He has a normal mood and affect.  Nursing note and vitals reviewed.   ED Course  Procedures (including critical care time) Labs Review Labs Reviewed  POCT URINALYSIS DIP (MANUAL ENTRY) - Normal  POCT CBC W AUTO DIFF ([facility]) - Normal    Imaging Review No results found.   MDM I counseled patient on results he has a normal white blood cell count urine is negative there is no blood. Patient is currently afebrile. Patient advised I'm concerned due to abdominal pain, nausea and anorexia. Patient feels like symptoms are improving. I discussed the option off going to the emergency department for CT scan. Patient reports he wants to see if symptoms continue to improve. I advised patient if symptoms have not resolved within 12 hours he needs to go to the emergency department for CT scan. I explained to patient signs and symptoms are of appendicitis and progression of illness. He agrees if symptoms worsen or change he will go to the emergency department immediately. He will recheck at [facility] [place] in 4  hours if symptoms have not resolved.    1. Lower abdominal pain    AVS    Fransico Meadow, PA-C 08/06/14 1430

## 2014-08-08 ENCOUNTER — Telehealth: Payer: Self-pay | Admitting: Emergency Medicine

## 2014-08-30 ENCOUNTER — Emergency Department (INDEPENDENT_AMBULATORY_CARE_PROVIDER_SITE_OTHER)
Admission: EM | Admit: 2014-08-30 | Discharge: 2014-08-30 | Disposition: A | Discharge status: Home / Self Care | Source: Home / Self Care | Attending: Family Medicine | Admitting: Family Medicine

## 2014-08-30 ENCOUNTER — Encounter: Payer: Self-pay | Admitting: *Deleted

## 2014-08-30 DIAGNOSIS — L739 Follicular disorder, unspecified: Secondary | ICD-10-CM

## 2014-08-30 MED ORDER — TRIAMCINOLONE ACETONIDE 0.1 % EX CREA: 1.0000 "application " | TOPICAL_CREAM | Freq: Two times a day (BID) | CUTANEOUS | Status: DC

## 2014-08-30 MED ORDER — DOXYCYCLINE HYCLATE 100 MG PO CAPS: 100.0000 mg | ORAL_CAPSULE | Freq: Two times a day (BID) | ORAL | Status: DC

## 2014-08-30 NOTE — Discharge Instructions (Signed)
Minimize baths/showers.  Use a mild bath soap containing oil such as unscented Dove.  Apply medicated cream immediately after bathing.  Use clean socks daily.     Folliculitis  Folliculitis is redness, soreness, and swelling (inflammation) of the hair follicles. This condition can occur anywhere on the body. People with weakened immune systems, diabetes, or obesity have a greater risk of getting folliculitis. CAUSES  Bacterial infection. This is the most common cause.  Fungal infection.  Viral infection.  Contact with certain chemicals, especially oils and tars. Long-term folliculitis can result from bacteria that live in the nostrils. The bacteria may trigger multiple outbreaks of folliculitis over time. SYMPTOMS Folliculitis most commonly occurs on the scalp, thighs, legs, back, buttocks, and areas where hair is shaved frequently. An early sign of folliculitis is a small, white or yellow, pus-filled, itchy lesion (pustule). These lesions appear on a red, inflamed follicle. They are usually less than 0.2 inches (5 mm) wide. When there is an infection of the follicle that goes deeper, it becomes a boil or furuncle. A group of closely packed boils creates a larger lesion (carbuncle). Carbuncles tend to occur in hairy, sweaty areas of the body. DIAGNOSIS  Your caregiver can usually tell what is wrong by doing a physical exam. A sample may be taken from one of the lesions and tested in a lab. This can help determine what is causing your folliculitis. TREATMENT  Treatment may include:  Applying warm compresses to the affected areas.  Taking antibiotic medicines orally or applying them to the skin.  Draining the lesions if they contain a large amount of pus or fluid.  Laser hair removal for cases of long-lasting folliculitis. This helps to prevent regrowth of the hair. HOME CARE INSTRUCTIONS  Apply warm compresses to the affected areas as directed by your caregiver.  If antibiotics are  prescribed, take them as directed. Finish them even if you start to feel better.  You may take over-the-counter medicines to relieve itching.  Do not shave irritated skin.  Follow up with your caregiver as directed. SEEK IMMEDIATE MEDICAL CARE IF:   You have increasing redness, swelling, or pain in the affected area.  You have a fever. MAKE SURE YOU:  Understand these instructions.  Will watch your condition.  Will get help right away if you are not doing well or get worse. Document Released: 11/04/2001 Document Revised: 02/25/2012 Document Reviewed: 11/26/2011 [facility] Patient Information 2015 [place], [place]. This information is not intended to replace advice given to you by your health care provider. Make sure you discuss any questions you have with your health care provider.

## 2014-08-30 NOTE — ED Provider Notes (Signed)
CSN: [id number]     Arrival date & time 08/30/14  1751 History   First MD Initiated Contact with Patient 08/30/14 [phone]     Chief Complaint  Patient presents with  . Rash     HPI Comments: Patient complains of a one month history of pruritic rash on his lower legs.  Patient is a 33 y.o. male presenting with rash. The history is provided by the patient.  Rash Location: both lower legs. Quality comment:  Itching Severity:  Moderate Onset quality:  Gradual Duration:  1 month Timing:  Constant Progression:  Worsening Chronicity:  New Context: not animal contact, not chemical exposure, not exposure to similar rash, not hot tub use, not insect bite/sting, not medications, not new detergent/soap and not plant contact   Relieved by:  Nothing Worsened by:  Nothing tried Ineffective treatments: witch hazel and hydrogen peroxide. Associated symptoms: no fatigue, no fever and no induration     History reviewed. No pertinent past medical history. History reviewed. No pertinent past surgical history. Family History  Problem Relation Age of Onset  . Cancer Father     prostate   History  Substance Use Topics  . Smoking status: Current Every Day Smoker -- 0.50 packs/day for 10 years  . Smokeless tobacco: Never Used  . Alcohol Use: Yes    Review of Systems  Constitutional: Negative for fever and fatigue.  Skin: Positive for rash.  All other systems reviewed and are negative.   Allergies  Review of patient's allergies indicates no known allergies.  Home Medications   Prior to Admission medications   Medication Sig Start Date End Date Taking? Authorizing Provider  doxycycline (VIBRAMYCIN) 100 MG capsule Take 1 capsule (100 mg total) by mouth 2 (two) times daily. Take with food. 08/30/14   Kandra Nicolas, MD  triamcinolone cream (KENALOG) 0.1 % Apply 1 application topically 2 (two) times daily. Apply thin film to lower legs. 08/30/14   Kandra Nicolas, MD   BP 133/77 mmHg  Pulse  92  Temp(Src) 97.9 F (36.6 C) (Oral)  Resp 14  Wt 226 lb (102.513 kg)  SpO2 99% Physical Exam  Constitutional: He is oriented to person, place, and time. He appears well-developed and well-nourished. No distress.  HENT:  Head: Normocephalic.  Eyes: Conjunctivae are normal. Pupils are equal, round, and reactive to light.  Neurological: He is alert and oriented to person, place, and time.  Skin: Skin is warm and dry. Rash noted. Rash is not vesicular.     Skin below the knees has numerous small erythematous excoriations centered on hair follicles.  No pustules, swelling, tenderness, or drainage.  Nursing note and vitals reviewed.   ED Course  Procedures       MDM   1. Folliculitis    Begin doxycycline for staph coverage.  Triamcinolone 0.1% cream BID   Minimize baths/showers.  Use a mild bath soap containing oil such as unscented Dove.  Apply medicated cream immediately after bathing.  Use clean socks daily.   Followup with dermatologist if not resolved 2 weeks.    Kandra Nicolas, MD 09/05/14 1018

## 2014-08-30 NOTE — ED Notes (Signed)
Pt c/o rash to lower legs x 1 month. Used peroxide, witch hazel, and neosporin x 1 month. No new socks or boots.

## 2015-01-09 ENCOUNTER — Emergency Department
Admission: EM | Admit: 2015-01-09 | Discharge: 2015-01-09 | Discharge status: Absent without leave | Source: Home / Self Care

## 2015-01-09 ENCOUNTER — Encounter: Payer: Self-pay | Admitting: Emergency Medicine

## 2015-01-09 ENCOUNTER — Emergency Department (INDEPENDENT_AMBULATORY_CARE_PROVIDER_SITE_OTHER)
Admission: EM | Admit: 2015-01-09 | Discharge: 2015-01-09 | Disposition: A | Discharge status: Home / Self Care | Source: Home / Self Care | Attending: Family Medicine | Admitting: Family Medicine

## 2015-01-09 DIAGNOSIS — J209 Acute bronchitis, unspecified: Secondary | ICD-10-CM | POA: Diagnosis not present

## 2015-01-09 MED ORDER — AZITHROMYCIN 250 MG PO TABS: ORAL_TABLET | ORAL | Status: DC

## 2015-01-09 NOTE — Discharge Instructions (Signed)
Take plain guaifenesin (1200mg  extended release tabs such as Mucinex) twice daily, with plenty of water, for cough and congestion.  May add Pseudoephedrine (30mg , one or two every 4 to 6 hours) for sinus congestion, if desired.  Get adequate rest.   Stop all antihistamines for now, and other non-prescription cough/cold preparations. May take Delsym Cough Suppressant at bedtime for nighttime cough.    Follow-up with family doctor if not improving about10 days.

## 2015-01-09 NOTE — ED Notes (Signed)
Productive cough x 3-4 weeks, light green mucus

## 2015-01-09 NOTE — ED Provider Notes (Signed)
CSN: [id number]     Arrival date & time 01/09/15  1253 History   First MD Initiated Contact with Patient 01/09/15 1303     Chief Complaint  Patient presents with  . Cough      HPI Comments: Patient reports that he developed a cold-like illness about one month ago with initial sore throat, sinus congestion, and cough.  The cough has persisted and has become somewhat productive.  No pleuritic pain, and no fevers, chills, and sweats. Patient reports that he has quit smoking.  The history is provided by the patient.    History reviewed. No pertinent past medical history. History reviewed. No pertinent past surgical history. Family History  Problem Relation Age of Onset  . Cancer Father     prostate   History  Substance Use Topics  . Smoking status: Current Every Day Smoker -- 0.50 packs/day for 10 years  . Smokeless tobacco: Never Used  . Alcohol Use: Yes    Review of Systems No sore throat + cough No pleuritic pain No wheezing No nasal congestion No post-nasal drainage No sinus pain/pressure No itchy/red eyes No earache No hemoptysis No SOB No fever/chills No nausea No vomiting No abdominal pain No diarrhea No urinary symptoms No skin rash No fatigue No myalgias No headache Used OTC meds without relief  Allergies  Review of patient's allergies indicates not on file.  Home Medications   Prior to Admission medications   Medication Sig Start Date End Date Taking? Authorizing Provider  azithromycin (ZITHROMAX Z-PAK) 250 MG tablet Take 2 tabs today; then begin one tab once daily for 4 more days. 01/09/15   Kandra Nicolas, MD  triamcinolone cream (KENALOG) 0.1 % Apply 1 application topically 2 (two) times daily. Apply thin film to lower legs. 08/30/14   Kandra Nicolas, MD   BP 131/80 mmHg  Pulse 78  Temp(Src) 98.3 F (36.8 C) (Oral)  Ht 6\' 5"  (1.956 m)  Wt 232 lb (105.235 kg)  BMI 27.51 kg/m2  SpO2 97% Physical Exam Nursing notes and Vital Signs  reviewed. Appearance:  Patient appears stated age, and in no acute distress Eyes:  Pupils are equal, round, and reactive to light and accomodation.  Extraocular movement is intact.  Conjunctivae are not inflamed  Ears:  Canals normal.  Tympanic membranes normal.  Nose:   Normal turbinates.  No sinus tenderness.    Pharynx:  Normal Neck:  Supple.  No adenopathy  Lungs:  Clear to auscultation.  Breath sounds are equal.  Heart:  Regular rate and rhythm without murmurs, rubs, or gallops.  Abdomen:  Nontender without masses or hepatosplenomegaly.  Bowel sounds are present.  No CVA or flank tenderness.  Extremities:  No edema.  No calf tenderness Skin:  No rash present.   ED Course  Procedures  none   MDM   1. Acute bronchitis, unspecified organism    Begin Z-pack for atypical coverage. Take plain guaifenesin (1200mg  extended release tabs such as Mucinex) twice daily, with plenty of water, for cough and congestion.  May add Pseudoephedrine (30mg , one or two every 4 to 6 hours) for sinus congestion, if desired.  Get adequate rest.   Stop all antihistamines for now, and other non-prescription cough/cold preparations. May take Delsym Cough Suppressant at bedtime for nighttime cough.    Follow-up with family doctor if not improving about10 days.     Kandra Nicolas, MD 01/09/15 1328

## 2015-01-13 ENCOUNTER — Telehealth: Payer: Self-pay | Admitting: Emergency Medicine

## 2015-06-11 ENCOUNTER — Emergency Department (INDEPENDENT_AMBULATORY_CARE_PROVIDER_SITE_OTHER)
Admission: EM | Admit: 2015-06-11 | Discharge: 2015-06-11 | Disposition: A | Discharge status: Home / Self Care | Source: Home / Self Care | Attending: Family Medicine | Admitting: Family Medicine

## 2015-06-11 ENCOUNTER — Encounter: Payer: Self-pay | Admitting: Emergency Medicine

## 2015-06-11 ENCOUNTER — Emergency Department (INDEPENDENT_AMBULATORY_CARE_PROVIDER_SITE_OTHER)

## 2015-06-11 DIAGNOSIS — M542 Cervicalgia: Secondary | ICD-10-CM | POA: Diagnosis not present

## 2015-06-11 DIAGNOSIS — S134XXA Sprain of ligaments of cervical spine, initial encounter: Secondary | ICD-10-CM

## 2015-06-11 DIAGNOSIS — S139XXA Sprain of joints and ligaments of unspecified parts of neck, initial encounter: Secondary | ICD-10-CM

## 2015-06-11 MED ORDER — PREDNISONE 50 MG PO TABS: ORAL_TABLET | ORAL | Status: DC

## 2015-06-11 MED ORDER — CYCLOBENZAPRINE HCL 10 MG PO TABS: ORAL_TABLET | ORAL | Status: DC

## 2015-06-11 NOTE — ED Notes (Signed)
Pt c/o possible pinched nerve in neck.  Pain is radiating down to both hands.  No apparent injury.

## 2015-06-11 NOTE — Discharge Instructions (Signed)
Apply ice pack for 20 to 30 minutes, 3 to 4 times daily  Continue until pain decreases.  Begin neck range of motion and stretching exercises as tolerated.   Cervical Sprain A cervical sprain is an injury in the neck in which the strong, fibrous tissues (ligaments) that connect your neck bones stretch or tear. Cervical sprains can range from mild to severe. Severe cervical sprains can cause the neck vertebrae to be unstable. This can lead to damage of the spinal cord and can result in serious nervous system problems. The amount of time it takes for a cervical sprain to get better depends on the cause and extent of the injury. Most cervical sprains heal in 1 to 3 weeks. CAUSES  Severe cervical sprains may be caused by:   Contact sport injuries (such as from football, rugby, wrestling, hockey, auto racing, gymnastics, diving, martial arts, or boxing).   Motor vehicle collisions.   Whiplash injuries. This is an injury from a sudden forward and backward whipping movement of the head and neck.  Falls.  Mild cervical sprains may be caused by:   Being in an awkward position, such as while cradling a telephone between your ear and shoulder.   Sitting in a chair that does not offer proper support.   Working at a poorly Landscape architect station.   Looking up or down for long periods of time.  SYMPTOMS   Pain, soreness, stiffness, or a burning sensation in the front, back, or sides of the neck. This discomfort may develop immediately after the injury or slowly, 24 hours or more after the injury.   Pain or tenderness directly in the middle of the back of the neck.   Shoulder or upper back pain.   Limited ability to move the neck.   Headache.   Dizziness.   Weakness, numbness, or tingling in the hands or arms.   Muscle spasms.   Difficulty swallowing or chewing.   Tenderness and swelling of the neck.  DIAGNOSIS  Most of the time your health care provider can  diagnose a cervical sprain by taking your history and doing a physical exam. Your health care provider will ask about previous neck injuries and any known neck problems, such as arthritis in the neck. X-rays may be taken to find out if there are any other problems, such as with the bones of the neck. Other tests, such as a CT scan or MRI, may also be needed.  TREATMENT  Treatment depends on the severity of the cervical sprain. Mild sprains can be treated with rest, keeping the neck in place (immobilization), and pain medicines. Severe cervical sprains are immediately immobilized. Further treatment is done to help with pain, muscle spasms, and other symptoms and may include:  Medicines, such as pain relievers, numbing medicines, or muscle relaxants.   Physical therapy. This may involve stretching exercises, strengthening exercises, and posture training. Exercises and improved posture can help stabilize the neck, strengthen muscles, and help stop symptoms from returning.  HOME CARE INSTRUCTIONS   Put ice on the injured area.   Put ice in a plastic bag.   Place a towel between your skin and the bag.   Leave the ice on for 15-20 minutes, 3-4 times a day.   If your injury was severe, you may have been given a cervical collar to wear. A cervical collar is a two-piece collar designed to keep your neck from moving while it heals.  Do not remove the collar unless instructed  by your health care provider.  If you have long hair, keep it outside of the collar.  Ask your health care provider before making any adjustments to your collar. Minor adjustments may be required over time to improve comfort and reduce pressure on your chin or on the back of your head.  Ifyou are allowed to remove the collar for cleaning or bathing, follow your health care provider's instructions on how to do so safely.  Keep your collar clean by wiping it with mild soap and water and drying it completely. If the collar  you have been given includes removable pads, remove them every 1-2 days and hand wash them with soap and water. Allow them to air dry. They should be completely dry before you wear them in the collar.  If you are allowed to remove the collar for cleaning and bathing, wash and dry the skin of your neck. Check your skin for irritation or sores. If you see any, tell your health care provider.  Do not drive while wearing the collar.   Only take over-the-counter or prescription medicines for pain, discomfort, or fever as directed by your health care provider.   Keep all follow-up appointments as directed by your health care provider.   Keep all physical therapy appointments as directed by your health care provider.   Make any needed adjustments to your workstation to promote good posture.   Avoid positions and activities that make your symptoms worse.   Warm up and stretch before being active to help prevent problems.  SEEK MEDICAL CARE IF:   Your pain is not controlled with medicine.   You are unable to decrease your pain medicine over time as planned.   Your activity level is not improving as expected.  SEEK IMMEDIATE MEDICAL CARE IF:   You develop any bleeding.  You develop stomach upset.  You have signs of an allergic reaction to your medicine.   Your symptoms get worse.   You develop new, unexplained symptoms.   You have numbness, tingling, weakness, or paralysis in any part of your body.  MAKE SURE YOU:   Understand these instructions.  Will watch your condition.  Will get help right away if you are not doing well or get worse. Document Released: 06/23/2007 Document Revised: 08/31/2013 Document Reviewed: 03/03/2013 [facility] Patient Information 2015 [place], [place]. This information is not intended to replace advice given to you by your health care provider. Make sure you discuss any questions you have with your health care provider.

## 2015-06-11 NOTE — ED Provider Notes (Signed)
CSN: [id number]     Arrival date & time 06/11/15  1150 History   First MD Initiated Contact with Patient 06/11/15 1347     Chief Complaint  Patient presents with  . Neck Pain     HPI Comments: Patient complains of onset yesterday of bilateral posterior neck pain with intermittent tingling radiation into the 4th and 5th fingers of each hand. He recalls no injury.  However, he is in the [employer] and about 1.5 weeks ago was called up for duty in [place] where he was wearing body armor that weighted 40 to 50 pounds.  He returned home 5 days ago.   Patient is a 34 y.o. male presenting with neck injury. The history is provided by the patient.  Neck Injury This is a new problem. The current episode started yesterday. The problem occurs constantly. The problem has not changed since onset.Associated symptoms comments: Tingling sensation in fingers. Exacerbated by: neck movement. The symptoms are relieved by NSAIDs. He has tried nothing for the symptoms.    History reviewed. No pertinent past medical history. History reviewed. No pertinent past surgical history. Family History  Problem Relation Age of Onset  . Cancer Father     prostate   Social History  Substance Use Topics  . Smoking status: Current Every Day Smoker -- 0.50 packs/day for 10 years  . Smokeless tobacco: Never Used  . Alcohol Use: Yes    Review of Systems  All other systems reviewed and are negative.   Allergies  Review of patient's allergies indicates no known allergies.  Home Medications   Prior to Admission medications   Medication Sig Start Date End Date Taking? Authorizing Provider  cyclobenzaprine (FLEXERIL) 10 MG tablet Take one tab by mouth at bedtime for muscle spasm 06/11/15   Kandra Nicolas, MD  predniSONE (DELTASONE) 50 MG tablet Take one tab by mouth with food once daily for five days 06/11/15   Kandra Nicolas, MD   Meds Ordered and Administered this Visit  Medications - No data to display  BP  119/77 mmHg  Pulse 60  Temp(Src) 98.3 F (36.8 C) (Oral)  Ht 6\' 3"  (1.905 m)  Wt 238 lb (107.956 kg)  BMI 29.75 kg/m2  SpO2 98% No data found.   Physical Exam  Constitutional: He is oriented to person, place, and time. He appears well-developed and well-nourished. No distress.  HENT:  Head: Normocephalic.  Right Ear: External ear normal.  Left Ear: External ear normal.  Nose: Nose normal.  Mouth/Throat: Oropharynx is clear and moist.  Eyes: Conjunctivae are normal. Pupils are equal, round, and reactive to light.  Neck: Neck supple. Muscular tenderness present. Decreased range of motion present.    Neck has decreased range of motion in all planes.  There is bilateral tenderness to palpation over the sternocleidomastoid muscles and trapezius muscles as noted on diagram. Distal neurovascular function is intact.     Cardiovascular: Normal heart sounds.   Pulmonary/Chest: Breath sounds normal.  Abdominal: There is no tenderness.  Musculoskeletal: He exhibits no edema or tenderness.  Lymphadenopathy:    He has no cervical adenopathy.  Neurological: He is alert and oriented to person, place, and time. No cranial nerve deficit. Coordination normal.  Skin: Skin is warm and dry. No rash noted.  Nursing note and vitals reviewed.   ED Course  Procedures  None    Imaging Review Dg Cervical Spine Complete  06/11/2015   CLINICAL DATA:  Posterior neck pain since Friday.  No known injury.  EXAM: CERVICAL SPINE  4+ VIEWS  COMPARISON:  None.  FINDINGS: There is no evidence of cervical spine fracture or prevertebral soft tissue swelling. Alignment is normal. No other significant bone abnormalities are identified. Loss of the normal cervical lordosis with straightening.  IMPRESSION: No acute osseous abnormality of the cervical spine. Loss of the normal cervical lordosis with straightening as can be seen with spasm.   Electronically Signed   By: Kathreen Devoid   On: 06/11/2015 14:34     MDM    1. Cervical sprain, initial encounter    Begin prednisone burst.  Flexeril 10mg  HS Apply ice pack for 20 to 30 minutes, 3 to 4 times daily  Continue until pain decreases.  Begin neck range of motion and stretching exercises as tolerated. Followup with Dr. Aundria Mems or Dr. Lynne Leader ([facility]) if not improving about one week or if paresthesias persist/recur.    Kandra Nicolas, MD 06/13/15 1006

## 2015-06-18 ENCOUNTER — Emergency Department (INDEPENDENT_AMBULATORY_CARE_PROVIDER_SITE_OTHER)
Admission: EM | Admit: 2015-06-18 | Discharge: 2015-06-18 | Disposition: A | Discharge status: Home / Self Care | Source: Home / Self Care | Attending: Emergency Medicine | Admitting: Emergency Medicine

## 2015-06-18 DIAGNOSIS — M542 Cervicalgia: Secondary | ICD-10-CM

## 2015-06-18 NOTE — ED Provider Notes (Signed)
CSN: [id number]     Arrival date & time 06/18/15  1341 History   First MD Initiated Contact with Patient 06/18/15 1354     Chief Complaint  Patient presents with  . Neck Pain   (Consider location/radiation/quality/duration/timing/severity/associated sxs/prior Treatment) HPI Comments: Pt needs a note to return to his regular job.  Pt was seen with neck pain on 10/2.  Pt reports pain has complexly resolved and he wants to do his regular job but he needs a note to return.  Pt reports no pain. No current symptoms  Patient is a 34 y.o. male presenting with neck pain. The history is provided by the patient. No language interpreter was used.  Neck Pain   History reviewed. No pertinent past medical history. History reviewed. No pertinent past surgical history. Family History  Problem Relation Age of Onset  . Cancer Father     prostate   Social History  Substance Use Topics  . Smoking status: Current Every Day Smoker -- 0.50 packs/day for 10 years  . Smokeless tobacco: Never Used  . Alcohol Use: Yes    Review of Systems  Musculoskeletal: Positive for neck pain.  All other systems reviewed and are negative.   Allergies  Review of patient's allergies indicates no known allergies.  Home Medications   Prior to Admission medications   Medication Sig Start Date End Date Taking? Authorizing Provider  cyclobenzaprine (FLEXERIL) 10 MG tablet Take one tab by mouth at bedtime for muscle spasm 06/11/15   Kandra Nicolas, MD  predniSONE (DELTASONE) 50 MG tablet Take one tab by mouth with food once daily for five days 06/11/15   Kandra Nicolas, MD   Meds Ordered and Administered this Visit  Medications - No data to display  There were no vitals taken for this visit. No data found.   Physical Exam  Constitutional: He is oriented to person, place, and time. He appears well-developed and well-nourished.  HENT:  Head: Normocephalic and atraumatic.  Eyes: Conjunctivae and EOM are normal.  Pupils are equal, round, and reactive to light.  Neck: Normal range of motion.  Cardiovascular: Normal rate.   Pulmonary/Chest: Effort normal.  Abdominal: Soft. He exhibits no distension.  Musculoskeletal: Normal range of motion. He exhibits no tenderness.  Neurological: He is alert and oriented to person, place, and time.  Skin: Skin is warm.  Psychiatric: He has a normal mood and affect.  Nursing note and vitals reviewed.   ED Course  Procedures (including critical care time)  Labs Review Labs Reviewed - No data to display  Imaging Review No results found.   Visual Acuity Review  Right Eye Distance:   Left Eye Distance:   Bilateral Distance:    Right Eye Near:   Left Eye Near:    Bilateral Near:         MDM   1. Neck pain       Fransico Meadow, [place] 06/18/15 1417

## 2015-06-18 NOTE — ED Notes (Signed)
Patient was seen 06/11/15 placed on light duty, he states he is better and his job needs him to be re-evaluated. He needs a return to work note.

## 2015-07-13 ENCOUNTER — Ambulatory Visit (INDEPENDENT_AMBULATORY_CARE_PROVIDER_SITE_OTHER): Admitting: Family Medicine

## 2015-07-13 ENCOUNTER — Encounter: Payer: Self-pay | Admitting: Family Medicine

## 2015-07-13 VITALS — BP 146/84 | HR 80 | Ht 76.0 in | Wt 241.0 lb

## 2015-07-13 DIAGNOSIS — M542 Cervicalgia: Secondary | ICD-10-CM

## 2015-07-13 NOTE — Progress Notes (Signed)
   Subjective:    I'm seeing this patient as a consultation for:  Dr Assunta Found  CC: Neck pain  HPI: Patient was seen in early October for cervical pain radiating to both ulnar hands. He was treated with prednisone and Flexeril and relative rest. Additionally he was restricted from work. 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 In the interim he has done well and is completely asymptomatic. However he needs a note to return to work full duties. His job requires him to distend his life potentially physically.  He feels that he is able to do his job duties fully. He is not taking any medications currently.  Past medical history, Surgical history, Family history not pertinant except as noted below, Social history, Allergies, and medications have been entered into the medical record, reviewed, and no changes needed.   Review of Systems: No headache, visual changes, nausea, vomiting, diarrhea, constipation, dizziness, abdominal pain, skin rash, fevers, chills, night sweats, weight loss, swollen lymph nodes, body aches, joint swelling, muscle aches, chest pain, shortness of breath, mood changes, visual or auditory hallucinations.   Objective:    Filed Vitals:   07/13/15 0820  BP: 146/84  Pulse: 80   General: Well Developed, well nourished, and in no acute distress.  Neuro/Psych: Alert and oriented x3, extra-ocular muscles intact, able to move all 4 extremities, sensation grossly intact. Skin: Warm and dry, no rashes noted.  Respiratory: Not using accessory muscles, speaking in full sentences, trachea midline.  Cardiovascular: Pulses palpable, no extremity edema. Abdomen: Does not appear distended. MSK: Neck: Nontender throughout. Normal neck range of motion. Negative Spurling's test. Upper extremity strength is intact and equal throughout. Sensation is intact throughout. Capillary refill are intact distally bilateral upper extremities  No  results found for this or any previous visit (from the past 24 hour(s)). No results found.  Impression and Recommendations:    Assessment and plan: 34 year old man with resolved neck pain with cervical radiculopathy. Patient is much improved and I feel is reasonable for him to return to work full duties. I recommend patient return in the near future to recheck blood pressure and perform wellness visit.  This case required medical decision making of moderate complexity.

## 2015-07-13 NOTE — Patient Instructions (Signed)
Thank you for coming in today. Come back or go to the emergency room if you notice new weakness new numbness problems walking or bowel or bladder problems. Return for wellness visit in the future.

## 2016-02-06 ENCOUNTER — Ambulatory Visit (INDEPENDENT_AMBULATORY_CARE_PROVIDER_SITE_OTHER)

## 2016-02-06 ENCOUNTER — Encounter: Payer: Self-pay | Admitting: Family Medicine

## 2016-02-06 ENCOUNTER — Ambulatory Visit (INDEPENDENT_AMBULATORY_CARE_PROVIDER_SITE_OTHER): Admitting: Family Medicine

## 2016-02-06 VITALS — BP 120/63 | HR 60 | Wt 237.0 lb

## 2016-02-06 DIAGNOSIS — M545 Low back pain, unspecified: Secondary | ICD-10-CM

## 2016-02-06 DIAGNOSIS — R1032 Left lower quadrant pain: Secondary | ICD-10-CM | POA: Diagnosis not present

## 2016-02-06 DIAGNOSIS — I861 Scrotal varices: Secondary | ICD-10-CM | POA: Diagnosis not present

## 2016-02-06 DIAGNOSIS — M4317 Spondylolisthesis, lumbosacral region: Secondary | ICD-10-CM | POA: Diagnosis not present

## 2016-02-06 DIAGNOSIS — M549 Dorsalgia, unspecified: Secondary | ICD-10-CM | POA: Insufficient documentation

## 2016-02-06 DIAGNOSIS — M419 Scoliosis, unspecified: Secondary | ICD-10-CM | POA: Diagnosis not present

## 2016-02-06 LAB — CBC
HEMATOCRIT: 44.2 % (ref 38.5–50.0)
Hemoglobin: 15.3 g/dL (ref 13.2–17.1)
MCH: 30.8 pg (ref 27.0–33.0)
MCHC: 34.6 g/dL (ref 32.0–36.0)
MCV: 89.1 fL (ref 80.0–100.0)
MPV: 10.7 fL (ref 7.5–12.5)
Platelets: 138 10*3/uL — ABNORMAL LOW (ref 140–400)
RBC: 4.96 MIL/uL (ref 4.20–5.80)
RDW: 13.2 % (ref 11.0–15.0)
WBC: 5.5 10*3/uL (ref 3.8–10.8)

## 2016-02-06 MED ORDER — POLYETHYLENE GLYCOL 3350 17 GM/SCOOP PO POWD: 17.0000 g | Freq: Every day | ORAL | Status: DC

## 2016-02-06 MED ORDER — CYCLOBENZAPRINE HCL 10 MG PO TABS: 10.0000 mg | ORAL_TABLET | Freq: Every day | ORAL | Status: DC

## 2016-02-06 NOTE — Progress Notes (Signed)
       Robert [name] is a 35 y.o. male who presents to [facility]: [facility] today for new left lower quadrant abdominal pain and new right low back pain.  1) left lower quadrant abdominal pain: This is been present now for about 2 months. It occurred without injury. Pain is nonradiating and mild and sharp in nature. Pain is intermittent and does not seem to be associated with activity or food. He denies any fevers or chills vomiting or diarrhea. He does not feel particularly constipated. He denies any urinary symptoms. He does note a history of left scrotum varicocele/hydrocele that has been evaluated at the [place] and found to be not malignant. He denies any pain radiating to his scrotum.  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 It's intermittent in nature and mild to moderate. Symptoms are worse in the morning when he gets up and improves throughout the day. He denies any radiating pain weakness or numbness. He typically takes ibuprofen for pain which helps a lot. He denies any specific injury.   No past medical history on file. No past surgical history on file. Social History  Substance Use Topics  . Smoking status: Current Every Day Smoker -- 0.50 packs/day for 10 years  . Smokeless tobacco: Never Used  . Alcohol Use: Yes   family history includes Cancer in his father.  ROS as above:  Medications: Current Outpatient Prescriptions  Medication Sig Dispense Refill  . cyclobenzaprine (FLEXERIL) 10 MG tablet Take 1 tablet (10 mg total) by mouth at bedtime. 30 tablet 0  . polyethylene glycol powder (GLYCOLAX/MIRALAX) powder Take 17 g by mouth daily. 850 g 1   No current facility-administered medications for this visit.   No Known Allergies   Exam:  BP 120/63 mmHg  Pulse 60  Wt 237 lb (107.502 kg)    Gen: Well NAD HEENT: EOMI,  MMM Lungs: Normal work of breathing. CTABL Heart: RRR no MRG Abd: NABS, Soft. Nondistended, Mildly tender left lower quadrant with no guarding or rebound. No masses palpated Groin: No inguinal lymphadenopathy. Right testicle is nontender with no masses or herniation. Left testicle was varicocele/hydrocele present nontender no herniation present. Exts: Brisk capillary refill, warm and well perfused.  Back: Nontender to midline. Mildly tender palpation right SI joint. Normal back motion pain with extension. Lower extremity strength is equal and normal throughout. Reflexes are diminished bilaterally but equal. Normal gait. Sensation intact throughout.   No results found for this or any previous visit (from the past 24 hour(s)). No results found.    Assessment and Plan: 35 y.o. male with   Back pain: Unclear etiology possible posterior element pain generator such as facet arthritis based on his history and exam. X-ray pending. Plan for trial of physical therapy NSAIDs and Flexeril. Recheck in 2-4 weeks  Abdominal pain: Unclear etiology. Left lower quadrant is concerning for diverticulitis. Plan for basic lab workup including CBC, CMP, and lipase. Trial of MiraLAX. If not better with consider CT scan.  Discussed warning signs or symptoms. Please see discharge instructions. Patient expresses understanding.

## 2016-02-06 NOTE — Patient Instructions (Signed)
Thank you for coming in today. Attend PT.  Use a heating pad.  Get xray of your back today.  Get labs today.  Use miralax daily.  Use flexeril muscle relaxer at bedtime as needed.   Come back or go to the emergency room if you notice new weakness new numbness problems walking or bowel or bladder problems. If your belly pain worsens, or you have high fever, bad vomiting, blood in your stool or black tarry stool go to the Emergency Room.   TENS UNIT: This is helpful for muscle pain and spasm.   Search and Purchase a TENS 7000 2nd edition at www.tenspros.com. It should be less than $30.     TENS unit instructions: Do not shower or bathe with the unit on Turn the unit off before removing electrodes or batteries If the electrodes lose stickiness add a drop of water to the electrodes after they are disconnected from the unit and place on plastic sheet. If you continued to have difficulty, call the TENS unit company to purchase more electrodes. Do not apply lotion on the skin area prior to use. Make sure the skin is clean and dry as this will help prolong the life of the electrodes. After use, always check skin for unusual red areas, rash or other skin difficulties. If there are any skin problems, does not apply electrodes to the same area. Never remove the electrodes from the unit by pulling the wires. Do not use the TENS unit or electrodes other than as directed. Do not change electrode placement without consultating your therapist or physician. Keep 2 fingers with between each electrode. Wear time ratio is 2:1, on to off times.    For example on for 30 minutes off for 15 minutes and then on for 30 minutes off for 15 minutes

## 2016-02-07 LAB — COMPREHENSIVE METABOLIC PANEL
ALK PHOS: 61 U/L (ref 40–115)
ALT: 30 U/L (ref 9–46)
AST: 22 U/L (ref 10–40)
Albumin: 4.2 g/dL (ref 3.6–5.1)
BUN: 17 mg/dL (ref 7–25)
CALCIUM: 9.2 mg/dL (ref 8.6–10.3)
CO2: 27 mmol/L (ref 20–31)
Chloride: 104 mmol/L (ref 98–110)
Creat: 0.97 mg/dL (ref 0.60–1.35)
Glucose, Bld: 118 mg/dL — ABNORMAL HIGH (ref 65–99)
POTASSIUM: 3.7 mmol/L (ref 3.5–5.3)
Sodium: 142 mmol/L (ref 135–146)
TOTAL PROTEIN: 6.1 g/dL (ref 6.1–8.1)
Total Bilirubin: 0.4 mg/dL (ref 0.2–1.2)

## 2016-02-07 LAB — LIPASE: LIPASE: 40 U/L (ref 7–60)

## 2016-02-07 NOTE — Progress Notes (Signed)
Quick Note:  Labs are normal with no explanation of abdominal pain. ______

## 2016-02-07 NOTE — Progress Notes (Signed)
Quick Note:  X-ray shows some arthritis and a condition called spondylolisthesis that typically gets better with physical therapy. Continue physical therapy. We'll recheck in about a month ______

## 2016-02-13 ENCOUNTER — Encounter: Payer: Self-pay | Admitting: Rehabilitative and Restorative Service Providers"

## 2016-02-13 ENCOUNTER — Ambulatory Visit (INDEPENDENT_AMBULATORY_CARE_PROVIDER_SITE_OTHER): Admitting: Rehabilitative and Restorative Service Providers"

## 2016-02-13 DIAGNOSIS — R29898 Other symptoms and signs involving the musculoskeletal system: Secondary | ICD-10-CM

## 2016-02-13 DIAGNOSIS — M545 Low back pain, unspecified: Secondary | ICD-10-CM

## 2016-02-13 DIAGNOSIS — M791 Myalgia, unspecified site: Secondary | ICD-10-CM

## 2016-02-13 NOTE — Therapy (Signed)
[facility], [place], [address] Phone: [phone]   Fax:  [phone]  Physical Therapy Evaluation  Patient Details  Name: Robert [name] MRN: [id number] Date of Birth: 12/30/1980 Referring Provider: Dr. Georgina Snell   Encounter Date: 02/13/2016      PT End of Session - 02/13/16 0847    Visit Number 1   Number of Visits 12   Date for PT Re-Evaluation 03/26/16   PT Start Time 0844   PT Stop Time 0940   PT Time Calculation (min) 56 min   Activity Tolerance Patient tolerated treatment well      History reviewed. No pertinent past medical history.  History reviewed. No pertinent past surgical history.  There were no vitals filed for this visit.       Subjective Assessment - 02/13/16 0850    Subjective Patient reports that he is having pain in the Rt lower back over the past 3 weeks with no known injury. Pain has improved some with limited activity. Has difficulty getting out of bed - having pain in the mornings.    Pertinent History Some back aches in the past nothing serious; denies any other medical problems   How long can you sit comfortably? no limit   How long can you stand comfortably? no limit   How long can you walk comfortably? no limit   Diagnostic tests xrays mild degenerative changes   Patient Stated Goals get rid of the back pain    Currently in Pain? Yes   Pain Score 3    Pain Location Back   Pain Orientation Lower;Right   Pain Descriptors / Indicators --  surprise type pain    Pain Type Acute pain   Pain Radiating Towards localized to Rt low back    Pain Onset 1 to 4 weeks ago   Pain Frequency Intermittent   Aggravating Factors  lying down sleeping at night - symptoms are worse in the morning. may be "surprised' at times during the day - not sure what causes pain    Pain Relieving Factors moving in the morning             [facility] PT Assessment - 02/13/16 0001    Assessment    Medical Diagnosis Rt LBP   Referring Provider Dr. Georgina Snell    Onset Date/Surgical Date 01/22/16   Hand Dominance Right   Next MD Visit no return scheduled    Prior Therapy none    Precautions   Precautions None   Balance Screen   Has the patient fallen in the past 6 months No   Has the patient had a decrease in activity level because of a fear of falling?  No   Is the patient reluctant to leave their home because of a fear of falling?  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,000 steps a day    Leisure yard work; some household activities; gym - not frequent in the past weeks tries to go 2x/wk - cardio and weights    Observation/Other Assessments   Focus on Therapeutic Outcomes (FOTO)  39% limitation    Sensation   Additional Comments WFL's    Posture/Postural Control   Posture Comments sitting posture flexed forward  at hips/trunk in slumped position; standing hips flexed forward; shoulders rounded and elevated; head forward    AROM   Lumbar Flexion 100%   Lumbar Extension 65%  discomfort Rt LB   Lumbar - Right Side Bend 70%   Lumbar - Left Side Bend 65%  pulling Rt LB    Lumbar - Right Rotation 55%   Lumbar - Left Rotation 45%  pulling Rt LB    Strength   Overall Strength Comments 5/5 bilat LE's    Flexibility   Hamstrings tight ~ 75-80 deg   Quadriceps tight Rt 7 in heel to buttock; Lt 6 in    ITB tight bilat    Piriformis tight Rt    Palpation   Spinal mobility tight and painful L5/S1 with CPA mobs grade III   SI assessment  WFL's   Palpation comment tight and tender Rt QL/piriformis    Ambulation/Gait   Gait Comments WFL's                    [facility] Adult PT Treatment/Exercise - 02/13/16 0001    Lumbar  Exercises: Stretches   Double Knee to Chest Stretch 3 reps;30 seconds   Piriformis Stretch 3 reps;30 seconds   Lumbar Exercises: Supine   Ab Set --  3 part core 10 sec x 10    Moist Heat Therapy   Number Minutes Moist Heat 18 Minutes   Moist Heat Location Lumbar Spine;Hip  Rt   Electrical Stimulation   Electrical Stimulation Location Rt QL Rt piriformis    Electrical Stimulation Action IFC   Electrical Stimulation Parameters to tolerance   Electrical Stimulation Goals Pain;Tone                PT Education - 02/13/16 [phone]    Education provided Yes   Education Details HEP; TENs info; TDN info   Person(s) Educated Patient   Methods Explanation;Demonstration;Tactile cues;Verbal cues;Handout   Comprehension Verbalized understanding;Returned demonstration;Verbal cues required;Tactile cues required             PT Long Term Goals - 02/13/16 1053    PT LONG TERM GOAL #1   Title Improve sitting/standing posture and alignment with patient to demonstrate imporved upright position. 03/26/16   Time 6   Period Weeks   Status New   PT LONG TERM GOAL #2   Title Improve tissue extensibility through Rt QL and hip abductors 03/26/16   Time 6   Period Weeks   Status New   PT LONG TERM GOAL #3   Title Patient will demonstrated and report proper body mechanics in static postures and with transitioinal movements 03/26/16   Time 6   Period Weeks   Status New   PT LONG TERM GOAL #4   Title Independent HEP 03/26/16   Time 6   Period Weeks   Status New   PT LONG TERM GOAL #5   Title Improve FOTO to </= 20% limitation 03/26/16   Time 6   Period Weeks   Status New               Plan - 02/13/16 1036    Clinical Impression Statement Patient presents with Rt LBP and Rt piriformis pain and tightness. He has pain with palpation through the QL and piriformis/glut medius areas. Symptoms were of sudden onset and have been present for ~ 3 weeks.    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    Consulted and Agree with Plan of Care Patient      Patient will benefit from skilled therapeutic intervention in order to improve the following deficits and impairments:  Postural dysfunction, Improper body mechanics, Pain, Decreased range of motion, Increased fascial restricitons, Decreased mobility, Decreased activity tolerance  Visit Diagnosis: Right-sided low back pain without sciatica - Plan: PT plan of care cert/re-cert  Myalgia - Plan: PT plan of care cert/re-cert  Other symptoms and signs involving the musculoskeletal system - Plan: PT plan of care cert/re-cert     Problem List Patient Active Problem List   Diagnosis Date Noted  . Back pain 02/06/2016  . Abdominal pain, left lower quadrant 02/06/2016  . Left varicocele 02/06/2016    Celyn Nilda Simmer PT, MPH  02/13/2016, 11:02 AM  [facility] [place], [place], [address] Phone: [phone]   Fax:  [phone]  Name: Robert [name] MRN: [id number] Date of Birth: Sep 24, 1980

## 2016-02-13 NOTE — Patient Instructions (Signed)
Supine Knee-to-Chest, Bilateral    Lie on back, hands clasped behind both knees. Pull knees in toward chest until a comfortable stretch is felt in lower back and buttocks. Hold _30__ seconds. Repeat _3__ times per session. Do _2-3__ sessions per day.   Piriformis Stretch    Lying on back, place foot across opposite thigh with foot resting on surface (can repeat pull right knee toward opposite shoulder).  Hold __30__ seconds. Repeat _3___ times. Do __2-3__ sessions per day.   Abdominal Bracing With Pelvic Floor (Hook-Lying)    With neutral spine, tighten pelvic floor and abdominals, sucking belly button to back bone; tighten muscles in low back at waist. Hold 10 sec  Repeat _10__ times. Do _several__ times a day. Progress to do this in sitting; standing; walking and with functional activities throughout the day.    TENS UNIT: This is helpful for muscle pain and spasm.   Search and Purchase a TENS 7000 2nd edition at www.tenspros.com. It should be less than $30.     TENS unit instructions: Do not shower or bathe with the unit on Turn the unit off before removing electrodes or batteries If the electrodes lose stickiness add a drop of water to the electrodes after they are disconnected from the unit and place on plastic sheet. If you continued to have difficulty, call the TENS unit company to purchase more electrodes. Do not apply lotion on the skin area prior to use. Make sure the skin is clean and dry as this will help prolong the life of the electrodes. After use, always check skin for unusual red areas, rash or other skin difficulties. If there are any skin problems, does not apply electrodes to the same area. Never remove the electrodes from the unit by pulling the wires. Do not use the TENS unit or electrodes other than as directed. Do not change electrode placement without consultating your therapist or physician. Keep 2 fingers with between each electrode.    Trigger  Point Dry Needling  . What is Trigger Point Dry Needling (DN)? o DN is a physical therapy technique used to treat muscle pain and dysfunction. Specifically, DN helps deactivate muscle trigger points (muscle knots).  o A thin filiform needle is used to penetrate the skin and stimulate the underlying trigger point. The goal is for a local twitch response (LTR) to occur and for the trigger point to relax. No medication of any kind is injected during the procedure.   . What Does Trigger Point Dry Needling Feel Like?  o The procedure feels different for each individual patient. Some patients report that they do not actually feel the needle enter the skin and overall the process is not painful. Very mild bleeding may occur. However, many patients feel a deep cramping in the muscle in which the needle was inserted. This is the local twitch response.   Marland Kitchen How Will I feel after the treatment? o Soreness is normal, and the onset of soreness may not occur for a few hours. Typically this soreness does not last longer than two days.  o Bruising is uncommon, however; ice can be used to decrease any possible bruising.  o In rare cases feeling tired or nauseous after the treatment is normal. In addition, your symptoms may get worse before they get better, this period will typically not last longer than 24 hours.   . What Can I do After My Treatment? o Increase your hydration by drinking more water for the next 24 hours.  o You may place ice or heat on the areas treated that have become sore, however, do not use heat on inflamed or bruised areas. Heat often brings more relief post needling. o You can continue your regular activities, but vigorous activity is not recommended initially after the treatment for 24 hours. o DN is best combined with other physical therapy such as strengthening, stretching, and other therapies.  o

## 2016-02-16 ENCOUNTER — Ambulatory Visit (INDEPENDENT_AMBULATORY_CARE_PROVIDER_SITE_OTHER): Admitting: Rehabilitative and Restorative Service Providers"

## 2016-02-16 ENCOUNTER — Encounter: Payer: Self-pay | Admitting: Rehabilitative and Restorative Service Providers"

## 2016-02-16 DIAGNOSIS — R29898 Other symptoms and signs involving the musculoskeletal system: Secondary | ICD-10-CM

## 2016-02-16 DIAGNOSIS — M791 Myalgia, unspecified site: Secondary | ICD-10-CM

## 2016-02-16 DIAGNOSIS — M545 Low back pain, unspecified: Secondary | ICD-10-CM

## 2016-02-16 NOTE — Therapy (Addendum)
[place], [place], [address] Phone: [phone]   Fax:  [phone]  Physical Therapy Treatment  Patient Details  Name: Robert [name] MRN: [id number] Date of Birth: 12/09/1980 Referring Provider: Dr. Georgina Snell   Encounter Date: 02/16/2016      PT End of Session - 02/16/16 0932    Visit Number 2   Number of Visits 12   Date for PT Re-Evaluation 03/26/16   PT Start Time 0932   PT Stop Time 1030   PT Time Calculation (min) 58 min   Activity Tolerance Patient tolerated treatment well      History reviewed. No pertinent past medical history.  History reviewed. No pertinent past surgical history.  There were no vitals filed for this visit.      Subjective Assessment - 02/16/16 0933    Subjective Robert [name] reports that his LB is about the same. He also notes that he has had some tightness and discomfort in the posterior shoulder area - which he has problems with intermittently.    Currently in Pain? Yes   Pain Score 4    Pain Location Back   Pain Orientation Lower;Right;Left   Pain Type Acute pain   Pain Onset 1 to 4 weeks ago                         [facility] Adult PT Treatment/Exercise - 02/16/16 0001    Lumbar Exercises: Stretches   Passive Hamstring Stretch 3 reps;30 seconds   Double Knee to Chest Stretch 3 reps;30 seconds   Piriformis Stretch 3 reps;30 seconds   Lumbar Exercises: Standing   Other Standing Lumbar Exercises hip ext; hip abduction qith core engaged 10 reps each LE    Lumbar Exercises: Supine   Ab Set --  3 part core 10 sec x 10    Lumbar Exercises: Prone   Other Prone Lumbar Exercises glut set 10 sec x 10    Moist Heat Therapy   Number Minutes Moist Heat 20 Minutes   Moist Heat Location Lumbar Spine;Hip  Rt   Electrical Stimulation   Electrical Stimulation Location Rt QL Rt piriformis    Electrical Stimulation Action IFC   Electrical Stimulation  Parameters to tolerance   Electrical Stimulation Goals Pain;Tone   Manual Therapy   Manual therapy comments pt prone   Soft tissue mobilization lumbar spine paraspinals; QL; piriformis and hip abductors/rotators           Trigger Point Dry Needling - 02/16/16 0950    Consent Given? Yes   Education Handout Provided Yes   Muscles Treated Lower Body --  QL; lumbar paraspinals - twitch/decreased tightness    Gluteus Maximus Response Twitch response elicited;Palpable increased muscle length   Gluteus Minimus Response Twitch response elicited;Palpable increased muscle length   Piriformis Response Twitch response elicited;Palpable increased muscle length              PT Education - 02/16/16 1026    Education provided Yes   Education Details HEP; spine care ed   Person(s) Educated Patient   Methods Explanation;Demonstration;Tactile cues;Verbal cues;Handout   Comprehension Verbalized understanding;Returned demonstration;Verbal cues required;Tactile cues required             PT Long Term Goals - 02/16/16 0934    PT LONG TERM GOAL #1   Title Improve sitting/standing posture and alignment with patient to demonstrate imporved upright position. 03/26/16   Time 6  Period Weeks   Status On-going   PT LONG TERM GOAL #2   Title Improve tissue extensibility through Rt QL and hip abductors 03/26/16   Time 6   Period Weeks   Status On-going   PT LONG TERM GOAL #3   Title Patient will demonstrated and report proper body mechanics in static postures and with transitioinal movements 03/26/16   Time 6   Period Weeks   Status On-going   PT LONG TERM GOAL #4   Title Independent HEP 03/26/16   Time 6   Period Weeks   Status On-going   PT LONG TERM GOAL #5   Title Improve FOTO to </= 20% limitation 03/26/16   Time 6   Period Weeks   Status On-going               Plan - 02/16/16 0935    Clinical Impression Statement Patient returns with report of no change in the LBP and  onset of Rt upper quadrant pain(which he has periodically). He tolerated TDN without difficulty. No change in symptoms - only second visit. 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 We will continue rehab efforts as scheduled.    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 Back pain 02/06/2016  . Abdominal pain, left lower quadrant 02/06/2016  . Left varicocele 02/06/2016    Robert [name] Nilda Simmer PT, MPH  02/16/2016, 12:50 PM  [facility] [place] [facility] [place], [place], [address] Phone: [phone]   Fax:  [phone]  Name: Robert [name] MRN: [id number] Date of Birth:  11/09/1980    PHYSICAL THERAPY DISCHARGE SUMMARY  Visits from Start of Care: 2  Current functional level related to goals / functional outcomes: See progress note for discharge status   Remaining deficits: See progress note    Education / Equipment: HEP Plan: Patient agrees to discharge.  Patient goals were not met. Patient is being discharged due to not returning since the last visit.  ?????     Deina Lipsey P. Helene Kelp PT, MPH 05/15/16 3:49 PM

## 2016-02-16 NOTE — Patient Instructions (Signed)
HIP: Hamstrings - Supine    Place strap around foot. Raise leg up, keep knee straight. Hold _30__ seconds. _3__ reps per set, ___  2-3 times per day,  Gluteal Sets   On stomach  Squeeze pelvic floor and hold. Tighten bottom. Hold for __10_ seconds. Relax for __1-2_ seconds. Repeat __10_ times. Do _2__ times a day.     Sleeping on Back  Place pillow under knees. A pillow with cervical support and a roll around waist are also helpful. Copyright  VHI. All rights reserved.  Sleeping on Side Place pillow between knees. Use cervical support under neck and a roll around waist as needed. Copyright  VHI. All rights reserved.   Sleeping on Stomach   If this is the only desirable sleeping position, place pillow under lower legs, and under stomach or chest as needed.  Posture - Sitting   Sit upright, head facing forward. Try using a roll to support lower back. Keep shoulders relaxed, and avoid rounded back. Keep hips level with knees. Avoid crossing legs for long periods. Stand to Sit / Sit to Stand   To sit: Bend knees to lower self onto front edge of chair, then scoot back on seat. To stand: Reverse sequence by placing one foot forward, and scoot to front of seat. Use rocking motion to stand up.   Work Height and Reach  Ideal work height is no more than 2 to 4 inches below elbow level when standing, and at elbow level when sitting. Reaching should be limited to arm's length, with elbows slightly bent.  Bending  Bend at hips and knees, not back. Keep feet shoulder-width apart.    Posture - Standing   Good posture is important. Avoid slouching and forward head thrust. Maintain curve in low back and align ears over shoul- ders, hips over ankles.  Alternating Positions   Alternate tasks and change positions frequently to reduce fatigue and muscle tension. Take rest breaks. Computer Work   Position work to Programmer, multimedia. Use proper work and seat height. Keep shoulders back  and down, wrists straight, and elbows at right angles. Use chair that provides full back support. Add footrest and lumbar roll as needed.  Getting Into / Out of Car  Lower self onto seat, scoot back, then bring in one leg at a time. Reverse sequence to get out.  Dressing  Lie on back to pull socks or slacks over feet, or sit and bend leg while keeping back straight.    Housework - Sink  Place one foot on ledge of cabinet under sink when standing at sink for prolonged periods.   Pushing / Pulling  Pushing is preferable to pulling. Keep back in proper alignment, and use leg muscles to do the work.  Deep Squat   Squat and lift with both arms held against upper trunk. Tighten stomach muscles without holding breath. Use smooth movements to avoid jerking.  Avoid Twisting   Avoid twisting or bending back. Pivot around using foot movements, and bend at knees if needed when reaching for articles.  Carrying Luggage   Distribute weight evenly on both sides. Use a cart whenever possible. Do not twist trunk. Move body as a unit.   Lifting Principles .Maintain proper posture and head alignment. .Slide object as close as possible before lifting. .Move obstacles out of the way. .Test before lifting; ask for help if too heavy. .Tighten stomach muscles without holding breath. .Use smooth movements; do not jerk. .Use legs to do the  work, and pivot with feet. .Distribute the work load symmetrically and close to the center of trunk. .Push instead of pull whenever possible.   Ask For Help   Ask for help and delegate to others when possible. Coordinate your movements when lifting together, and maintain the low back curve.  Log Roll   Lying on back, bend left knee and place left arm across chest. Roll all in one movement to the right. Reverse to roll to the left. Always move as one unit. Housework - Sweeping  Use long-handled equipment to avoid stooping.   Housework -  Wiping  Position yourself as close as possible to reach work surface. Avoid straining your back.  Laundry - Unloading Wash   To unload small items at bottom of washer, lift leg opposite to arm being used to reach.  [place] close to area to be raked. Use arm movements to do the work. Keep back straight and avoid twisting.     Cart  When reaching into cart with one arm, lift opposite leg to keep back straight.   Getting Into / Out of Bed  Lower self to lie down on one side by raising legs and lowering head at the same time. Use arms to assist moving without twisting. Bend both knees to roll onto back if desired. To sit up, start from lying on side, and use same move-ments in reverse. Housework - Vacuuming  Hold the vacuum with arm held at side. Step back and forth to move it, keeping head up. Avoid twisting.   Laundry - IT consultant so that bending and twisting can be avoided.   Laundry - Unloading Dryer  Squat down to reach into clothes dryer or use a reacher.  Gardening - Weeding / Probation officer or Kneel. Knee pads may be helpful.                  Strengthening: Hip Abduction - Resisted    With tubing around right leg, other side toward anchor, extend leg out from side. Repeat __10__ times per set. Do __2-3__ sets per session. Do __2__ sessions per day.    Strengthening: Hip Extension - Resisted    With tubing around right ankle, face anchor and pull leg straight back. Repeat _10___ times per set. Do ___2-3_ sets per session. Do __2__ sessions per day.

## 2016-04-09 ENCOUNTER — Encounter: Payer: Self-pay | Admitting: Family Medicine

## 2016-04-09 ENCOUNTER — Ambulatory Visit (INDEPENDENT_AMBULATORY_CARE_PROVIDER_SITE_OTHER): Admitting: Family Medicine

## 2016-04-09 VITALS — BP 120/81 | HR 60 | Wt 254.0 lb

## 2016-04-09 DIAGNOSIS — L989 Disorder of the skin and subcutaneous tissue, unspecified: Secondary | ICD-10-CM | POA: Diagnosis not present

## 2016-04-09 DIAGNOSIS — D229 Melanocytic nevi, unspecified: Secondary | ICD-10-CM | POA: Diagnosis not present

## 2016-04-09 NOTE — Progress Notes (Signed)
       Robert [name] is a 35 y.o. male who presents to [facility] today for skin lesion. Patient has multiple nevi on his back. He notes one on the left central thoracic back has enlarged and become raised and discolored recently. He is concerned it may be indicative of something bad and would like it removed. He feels well otherwise. He notes his abdominal pain has almost completely resolved. His back pain is manageable.   No past medical history on file. No past surgical history on file. Social History  Substance Use Topics  . Smoking status: Current Every Day Smoker    Packs/day: 0.50    Years: 10.00  . Smokeless tobacco: Never Used  . Alcohol use Yes   family history includes Cancer in his father.  ROS as above:  Medications: Current Outpatient Prescriptions  Medication Sig Dispense Refill  . cyclobenzaprine (FLEXERIL) 10 MG tablet Take 1 tablet (10 mg total) by mouth at bedtime. (Patient not taking: Reported on 04/09/2016) 30 tablet 0  . polyethylene glycol powder (GLYCOLAX/MIRALAX) powder Take 17 g by mouth daily. (Patient not taking: Reported on 04/09/2016) 850 g 1   No current facility-administered medications for this visit.    No Known Allergies   Exam:  BP 120/81   Pulse 60   Wt 254 lb (115.2 kg)   BMI 30.92 kg/m  Gen: Well NAD HEENT: EOMI,  MMM Lungs: Normal work of breathing. CTABL Heart: RRR no MRG Abd: NABS, Soft. Nondistended, Nontender Exts: Brisk capillary refill, warm and well perfused.  Skin: Thoracic back multiple flat small macular nevi some with irregular borders. One 0.6 cm raised discolored nevi mid thoracic back on the left.  Shave biopsy: Consent obtained timeout performed. Skin cleaned with alcohol. Cold spray applied. 1 ml of Lidocaine injected achieving good anesthesia. Skin was again cleaned with alcohol. A deep shave  biopsy was used to remove the skin lesion. A dressing was applied. Patient tolerated the procedure well.  No results found for this or any previous visit (from the past 24 hour(s)). No results found.    Assessment and Plan: 35 y.o. male with skin lesion possible seborrheic keratosis versus dysplastic nevus. Skin pathology ordered. Additionally will refer to dermatology for annual skin surveillance due to multiple nevi.   Orders Placed This Encounter  Procedures  . Ambulatory referral to Dermatology    Referral Priority:   Routine    Referral Type:   Consultation    Referral Reason:   Specialty Services Required    Requested Specialty:   Dermatology    Number of Visits Requested:   1    Discussed warning signs or symptoms. Please see discharge instructions. Patient expresses understanding.

## 2016-04-09 NOTE — Patient Instructions (Signed)
Thank you for coming in today. It well probably be about a week before we hear back from pathology about the skin lesion. Return as needed. You should hear from the dermatologist office in about a week.

## 2016-07-12 ENCOUNTER — Emergency Department (INDEPENDENT_AMBULATORY_CARE_PROVIDER_SITE_OTHER)
Admission: EM | Admit: 2016-07-12 | Discharge: 2016-07-12 | Disposition: A | Discharge status: Home / Self Care | Source: Home / Self Care | Attending: Family Medicine | Admitting: Family Medicine

## 2016-07-12 DIAGNOSIS — R51 Headache: Secondary | ICD-10-CM | POA: Diagnosis not present

## 2016-07-12 DIAGNOSIS — R519 Headache, unspecified: Secondary | ICD-10-CM

## 2016-07-12 NOTE — ED Provider Notes (Signed)
CSN: [id number]     Arrival date & time 07/12/16  1630 History   First MD Initiated Contact with Patient 07/12/16 1736     Chief Complaint  Patient presents with  . Migraine   (Consider location/radiation/quality/duration/timing/severity/associated sxs/prior Treatment) HPI  Robert [name] is a 35 y.o. male presenting to UC with c/o severe headache yesterday that was gradual in onset, c/w prior migraines, worse on Right side of head, associated photophobia.  Pain is 2/10 at this time.  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  Pt reports mild light sensitivity.  He has taken ibuprofen with moderate relief.  Pt requesting a note for work.     History reviewed. No pertinent past medical history. History reviewed. No pertinent surgical history. Family History  Problem Relation Age of Onset  . Cancer Father     prostate   Social History  Substance Use Topics  . Smoking status: Current Every Day Smoker    Packs/day: 0.50    Years: 10.00  . Smokeless tobacco: Never Used  . Alcohol use Yes    Review of Systems  Constitutional: Negative for chills and fever.  Eyes: Positive for photophobia. Negative for visual disturbance.  Gastrointestinal: Negative for diarrhea, nausea and vomiting.  Musculoskeletal: Negative for neck pain and neck stiffness.  Skin: Negative for rash.  Neurological: Positive for headaches. Negative for dizziness and light-headedness.    Allergies  Review of patient's allergies indicates no known allergies.  Home Medications   Prior to Admission medications   Medication Sig Start Date End Date Taking? Authorizing Provider  cyclobenzaprine (FLEXERIL) 10 MG tablet Take 1 tablet (10 mg total) by mouth at bedtime. Patient not taking: Reported on 04/09/2016 02/06/16   Gregor Hams, MD  polyethylene glycol powder (GLYCOLAX/MIRALAX) powder Take 17 g by mouth  daily. Patient not taking: Reported on 04/09/2016 02/06/16   Gregor Hams, MD   Meds Ordered and Administered this Visit  Medications - No data to display  BP 117/73 (BP Location: Left Arm)   Pulse 70   Temp 98 F (36.7 C) (Oral)   Ht 6\' 5"  (1.956 m)   Wt 249 lb 12.8 oz (113.3 kg)   SpO2 98%   BMI 29.62 kg/m  No data found.   Physical Exam  Constitutional: He is oriented to person, place, and time. He appears well-developed and well-nourished. No distress.  HENT:  Head: Normocephalic and atraumatic.  Right Ear: Tympanic membrane normal.  Left Ear: Tympanic membrane normal.  Nose: Nose normal.  Mouth/Throat: Uvula is midline, oropharynx is clear and moist and mucous membranes are normal.  Eyes: Conjunctivae and EOM are normal. Pupils are equal, round, and reactive to light.  Neck: Normal range of motion. Neck supple.  Cardiovascular: Normal rate and regular rhythm.   Pulmonary/Chest: Effort normal and breath sounds normal. No respiratory distress. He has no wheezes. He has no rales.  Musculoskeletal: Normal range of motion.  Neurological: He is alert and oriented to person, place, and time.  Speech is clear, alert to person, place and time. Normal gait.   Skin: Skin is warm and dry. He is not diaphoretic.  Psychiatric: He has a normal mood and affect. His behavior is normal.  Nursing note and vitals reviewed.   Urgent Care Course   Clinical Course    Procedures (including critical care time)  Labs Review Labs  Reviewed - No data to display  Imaging Review No results found.   MDM   1. Bad headache    Pt requesting work note for missing work yesterday and today due to migraine headache that is gradually resolving.   Pt appears well. No red flag symptoms. Pt declined medication in UC as HA is improving.  Encouraged to catch up on sleep and stay well hydrated. Pt may return to work as scheduled for Monday, 11/6.  F/u with PCP as needed for recurrent migraines.     Noland Fordyce, PA-C 07/12/16 1812

## 2016-07-12 NOTE — ED Triage Notes (Signed)
Pt had a migraine yesterday, mainly behind the right eye.  Pain has mostly resolved except for a little light sensitivity.

## 2016-07-22 ENCOUNTER — Ambulatory Visit (INDEPENDENT_AMBULATORY_CARE_PROVIDER_SITE_OTHER): Admitting: Family Medicine

## 2016-07-22 ENCOUNTER — Encounter: Payer: Self-pay | Admitting: Family Medicine

## 2016-07-22 DIAGNOSIS — S29012A Strain of muscle and tendon of back wall of thorax, initial encounter: Secondary | ICD-10-CM | POA: Diagnosis not present

## 2016-07-22 MED ORDER — CYCLOBENZAPRINE HCL 10 MG PO TABS: 10.0000 mg | ORAL_TABLET | Freq: Every day | ORAL | 0 refills | Status: DC

## 2016-07-22 NOTE — Patient Instructions (Signed)
Thank you for coming in today. Work on back exercises.  Attend PT.  Return for recheck in 4-6 weeks.

## 2016-07-22 NOTE — Progress Notes (Signed)
   Robert [name] is a 35 y.o. male who presents to [facility] today for shoulder/back pain.  He has had a sensation of tightness in his right shoulder blade area for the past 1 month. No changes in activity, trauma, or other identifiable triggers. The tightness associated with a mild dull pain, is constant throughout the day, and is more noticeable when he takes a deep breath. Stretching and massage hasn't worked. Ice and heat improves the tightness temporarily. No weakness or radiation of pain. He works as a Corporate treasurer.  His lower back pain is adequately controlled with home stretching exercises. Previously found PT and flexeril helped in the past, but flexeril "knocked him out."   No past medical history on file. No past surgical history on file. Social History  Substance Use Topics  . Smoking status: Current Every Day Smoker    Packs/day: 0.50    Years: 10.00  . Smokeless tobacco: Never Used  . Alcohol use Yes     ROS:  As above   Medications: Current Outpatient Prescriptions  Medication Sig Dispense Refill  . polyethylene glycol powder (GLYCOLAX/MIRALAX) powder Take 17 g by mouth daily. 850 g 1  . cyclobenzaprine (FLEXERIL) 10 MG tablet Take 1 tablet (10 mg total) by mouth at bedtime. 30 tablet 0   No current facility-administered medications for this visit.    No Known Allergies   Exam:  BP 119/64   Pulse (!) 56   Wt 252 lb (114.3 kg)   BMI 29.88 kg/m  General: Well Developed, well nourished, and in no acute distress.  Neuro/Psych: Alert and oriented x3, extra-ocular muscles intact, able to move all 4 extremities, sensation grossly intact. Skin: Warm and dry, no rashes noted.  Respiratory: Not using accessory muscles, speaking in full sentences, trachea midline.  Cardiovascular: Pulses palpable, no extremity edema. Abdomen: Does not appear distended. MSK: R shoulder: subjective tightness in rhomboid area with  forward flexion or abduction of R arm; also with squeezing shoulder blades together.   No results found for this or any previous visit (from the past 48 hour(s)). No results found.  Lumbar spine x-ray 02/06/16: Mild scoliosis concave left. Diffuse mild degenerative change with 4 mm L5 on S1 spondylolisthesis.  Cervical spine x-ray 06/11/15: No acute osseous abnormality of the cervical spine. Loss of the normal cervical lordosis with straightening as can be seen with spasm.  Assessment and Plan: 35 y.o. male with tightness in right shoulder area for 1 month, likely due to rhomboid strain. Plan on PT and flexeril. Advised strengthening back and shoulder blade muscles in the gym. Follow up in 4-6 weeks.    Orders Placed This Encounter  Procedures  . Ambulatory referral to Physical Therapy    Referral Priority:   Routine    Referral Type:   Physical Medicine    Referral Reason:   Specialty Services Required    Requested Specialty:   Physical Therapy    Number of Visits Requested:   1    Discussed warning signs or symptoms. Please see discharge instructions. Patient expresses understanding.

## 2016-07-30 ENCOUNTER — Encounter: Payer: Self-pay | Admitting: Rehabilitative and Restorative Service Providers"

## 2016-07-30 ENCOUNTER — Ambulatory Visit (INDEPENDENT_AMBULATORY_CARE_PROVIDER_SITE_OTHER): Admitting: Rehabilitative and Restorative Service Providers"

## 2016-07-30 DIAGNOSIS — R293 Abnormal posture: Secondary | ICD-10-CM

## 2016-07-30 DIAGNOSIS — M546 Pain in thoracic spine: Secondary | ICD-10-CM

## 2016-07-30 DIAGNOSIS — R29898 Other symptoms and signs involving the musculoskeletal system: Secondary | ICD-10-CM

## 2016-07-30 NOTE — Therapy (Signed)
[facility]  [address] [place], [place], [address] Phone: [phone]   Fax:  [phone]  Physical Therapy Evaluation  Patient Details  Name: Robert [name] MRN: [id number] Date of Birth: 1981-08-18 Referring Provider: Dr Georgina Snell  Encounter Date: 07/30/2016      PT End of Session - 07/30/16 1417    Visit Number 1   Number of Visits 12   Date for PT Re-Evaluation 09/10/16   PT Start Time [id number]   PT Stop Time 1515   PT Time Calculation (min) 56 min   Activity Tolerance Patient tolerated treatment well      History reviewed. No pertinent past medical history.  History reviewed. No pertinent surgical history.  There were no vitals filed for this visit.       Subjective Assessment - 07/30/16 1420    Subjective Patient reports that he has had mid back pain and discomfort in the Rt for the past 6 weeks or so. He does not recall any injury. He can feel tightness into the Rt thumb with occasional tingling in the thumb.    Pertinent History History of LBP treated in PT 6/17; denies any other medical problems    How long can you sit comfortably? no limit   How long can you stand comfortably? no limit   How long can you walk comfortably? no limit   Patient Stated Goals get rid og mid back pain and tingling into the Rt thumb    Currently in Pain? Yes   Pain Score 6    Pain Location Thoracic   Pain Orientation Right   Pain Descriptors / Indicators Dull;Constant   Pain Type Acute pain   Pain Radiating Towards tingling into the Rt thumb intermittently   Pain Onset More than a month ago   Pain Frequency Constant   Aggravating Factors  nothing specific   Pain Relieving Factors nothing             [facility] PT Assessment - 07/30/16 0001      Assessment   Medical Diagnosis Rhomboid strain   Referring Provider Dr Georgina Snell   Onset Date/Surgical Date 06/09/16   Hand Dominance Right   Next MD Visit PRN   Prior Therapy  for LB 2 visits 6 /17     Precautions   Precautions None     Balance Screen   Has the patient fallen in the past 6 months No   Has the patient had a decrease in activity level because of a fear of falling?  No   Is the patient reluctant to leave their home because of a fear of falling?  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[employer] ~8 yrs - sitting; standing; walking    Leisure yard work; family; gym ~ 2 -3 times/wk - cardio and wts ~ 45 min      Observation/Other Assessments   Focus on Therapeutic Outcomes (FOTO)  35% limitation     Sensation   Additional Comments tingling Rt thumb      Posture/Postural Control   Posture Comments head forward shoudlers rounded increased thoracic kyphosis; scapulae abducted and rotated along the thoracic spine      AROM   Right Shoulder Extension 66 Degrees   Right Shoulder Flexion 154 Degrees   Right Shoulder ABduction 157 Degrees   Right Shoulder Internal Rotation 46 Degrees  Right Shoulder External Rotation 96 Degrees   Left Shoulder Extension 53 Degrees   Left Shoulder Flexion 146 Degrees   Left Shoulder ABduction 152 Degrees   Left Shoulder Internal Rotation 42 Degrees   Left Shoulder External Rotation 94 Degrees   Cervical Flexion 51   Cervical Extension 39  discomfort neck   Cervical - Right Side Bend 25  discomfort    Cervical - Left Side Bend 31  mild discomfort   Cervical - Right Rotation 67   Cervical - Left Rotation 68     Strength   Overall Strength Comments 5/5 bilat UE's      Palpation   Spinal mobility pain with PA and lateral mobs T3/4/5 less so through upper thoracic    Palpation comment banding and tightness noted through the right thoracic paraspinals; bilat upper traps/leveator; pecs Rt > Lt      Special Tests    Special Tests --  (+) median neural tension test Rt > Lt                     [facility] Adult PT Treatment/Exercise - 07/30/16 0001      Therapeutic Activites    Therapeutic Activities --  self massage with 4 in ball     Neuro Re-ed    Neuro Re-ed Details  initiated work on posture and alignment     Shoulder Exercises: Supine   Other Supine Exercises supine with 3 inch swim noodle longitudinally and horizontally along spine 1-2 min intervals 4-5 trials   Other Supine Exercises median nerve mobilization 1 min x 2 reps each UE   axial extension head on pillow 10 sec x 5     Shoulder Exercises: Standing   Other Standing Exercises axial extension 10 sec x 5   Other Standing Exercises scap squeeze 10 sec x 10 with swim noodle      Moist Heat Therapy   Number Minutes Moist Heat 20 Minutes   Moist Heat Location --  thoracic spine     Electrical Stimulation   Electrical Stimulation Location Rt thoracic paraspinals   Electrical Stimulation Action IFC   Electrical Stimulation Parameters to tolerance   Electrical Stimulation Goals Tone;Pain  tightness                PT Education - 07/30/16 1501    Education provided Yes   Education Details postural correction; HEP   Person(s) Educated Patient   Methods Explanation;Demonstration;Tactile cues;Verbal cues;Handout   Comprehension Verbalized understanding;Returned demonstration;Verbal cues required;Tactile cues required             PT Long Term Goals - 07/30/16 1511      PT LONG TERM GOAL #1   Title Improve sitting/standing posture and alignment with patient to demonstrate imporved upright position. 09/10/16   Time 6   Period Weeks   Status New     PT LONG TERM GOAL #2   Title Improve tissue extensibility through Rt thoracic paraspinals 09/10/16   Time 6   Period Weeks   Status New     PT LONG TERM GOAL #3   Title Patient will demonstrated and report proper body mechanics in static postures and with transitioinal movements 09/10/16   Time 6   Period Weeks   Status New     PT LONG TERM GOAL #4    Title Independent HEP 09/10/16   Time 6   Period Weeks   Status New     PT LONG TERM  GOAL #5   Title Improve FOTO to </= 20% limitation 09/10/16   Time 6   Period Weeks   Status New               Plan - 07/30/16 1507    Clinical Impression Statement Patient presents with Rt thoracic pain which has been present for the past 6 weeks with no known injury. Patient has a history of spine pain and dysfunction in LB and mid back area. He has poor posture and alignmnet; limited spinal mobility; muscular tightness and tenderness to palpation through the thoracic paraspinals and upper traps/leveator Rt > Lt; pain with AP mobs through mid thoracic area; pain with functional activities and at rest. Patient has intermittent symptoms into the Rt thumb.    Rehab Potential Good   PT Frequency 2x / week   PT Duration 6 weeks   PT Treatment/Interventions Patient/family education;ADLs/Self Care Home Management;Cryotherapy;Electrical Stimulation;Iontophoresis 4mg /ml Dexamethasone;Moist Heat;Traction;Ultrasound;Dry needling;Manual techniques;Therapeutic activities;Therapeutic exercise   PT Next Visit Plan pec stretch; posterior shoulder girdle strengthening; TDN to the Rt thoracic paraspinals; modalities as indicated    Consulted and Agree with Plan of Care Patient      Patient will benefit from skilled therapeutic intervention in order to improve the following deficits and impairments:  Postural dysfunction, Improper body mechanics, Pain, Decreased range of motion, Decreased mobility, Increased fascial restricitons, Increased muscle spasms, Decreased activity tolerance  Visit Diagnosis: Acute right-sided thoracic back pain - Plan: PT plan of care cert/re-cert  Other symptoms and signs involving the musculoskeletal system - Plan: PT plan of care cert/re-cert  Abnormal posture - Plan: PT plan of care cert/re-cert     Problem List Patient Active Problem List   Diagnosis Date Noted  . Rhomboid  muscle strain 07/22/2016  . Multiple nevi 04/09/2016  . Back pain 02/06/2016  . Abdominal pain, left lower quadrant 02/06/2016  . Left varicocele 02/06/2016    Robert [name] Nilda Simmer PT, MPH  07/30/2016, 3:16 PM  [facility] [place] [address] [place], [place], [address] Phone: [phone]   Fax:  [phone]  Name: Robert [name] MRN: [id number] Date of Birth: July 30, 1981

## 2016-07-30 NOTE — Patient Instructions (Addendum)
Work on posture and alignment in sitting and standing  Self massage using ~ 4 inch rubber ball  Suboccipital Stretch (Supine)    With towel roll or pillow supporting head and neck, Gently tuck chin until stretch is felt at base of skull and upper neck. Hold __10__ seconds. Relax. Repeat _5-10___ times per set. Do __several__ sessions per day.   Axial Extension (Chin Tuck)    Pull chin in and lengthen back of neck. Hold _10___ seconds while counting out loud. Repeat __5-10__ times. Do __several__ sessions per day.    SUPINE Tips A    Being in the supine position means to be lying on the back. Lying on the back is the position of least compression on the bones and discs of the spine, and helps to re-align the natural curves of the back. Lying on back for 3-5 minutes - no pain. Can lie with swim noodle along spine for extra stretch   Can lie on back with noodle across spine at about shoulder blade area - can raise arms overhead to add to the stretch Work toward 3-5 min to stretch mid back area.  Neurovascular: Median Nerve Glide With Cervical Bias - Supine    Lie with neck supported, right arm out to side, elbow straight, thumb down, fingers and wrist bent back. Slowly move opposite side ear toward shoulder as far as possible without pain. Hold 60 sec Repeat _2___ times per set. Do __2__ sessions per day

## 2016-08-06 ENCOUNTER — Encounter: Payer: Self-pay | Admitting: Physical Therapy

## 2016-08-06 ENCOUNTER — Ambulatory Visit (INDEPENDENT_AMBULATORY_CARE_PROVIDER_SITE_OTHER): Admitting: Physical Therapy

## 2016-08-06 DIAGNOSIS — R29898 Other symptoms and signs involving the musculoskeletal system: Secondary | ICD-10-CM | POA: Diagnosis not present

## 2016-08-06 DIAGNOSIS — M546 Pain in thoracic spine: Secondary | ICD-10-CM | POA: Diagnosis not present

## 2016-08-06 DIAGNOSIS — R293 Abnormal posture: Secondary | ICD-10-CM

## 2016-08-06 NOTE — Therapy (Signed)
[place], [place], [address] Phone: [phone]   Fax:  [phone]  Physical Therapy Treatment  Patient Details  Name: Robert [name] MRN: [id number] Date of Birth: May 27, 1981 Referring Provider: Dr Georgina Snell  Encounter Date: 08/06/2016      PT End of Session - 08/06/16 1103    Visit Number 2   Number of Visits 12   Date for PT Re-Evaluation 09/10/16   PT Start Time 1103   PT Stop Time 1202   PT Time Calculation (min) 59 min   Activity Tolerance Patient tolerated treatment well      History reviewed. No pertinent past medical history.  History reviewed. No pertinent surgical history.  There were no vitals filed for this visit.      Subjective Assessment - 08/06/16 1104    Subjective t been to bad since the last visit, the pain is "there" no change really.  Pain with inhalations and sneezing   Currently in Pain? Yes   Pain Score 4    Pain Location Thoracic   Pain Orientation Right   Pain Descriptors / Indicators Dull;Constant   Pain Type Acute pain   Pain Radiating Towards closer to the shoulder blade   Pain Onset More than a month ago   Pain Frequency Constant   Aggravating Factors  inhalations and sneezing   Pain Relieving Factors nothing             [facility] PT Assessment - 08/06/16 0001      Assessment   Medical Diagnosis Rhomboid strain   Referring Provider Dr Georgina Snell   Onset Date/Surgical Date 06/09/16                     [facility] Adult PT Treatment/Exercise - 08/06/16 0001      Shoulder Exercises: Supine   Other Supine Exercises over foam roller in thoracic area.      Shoulder Exercises: Prone   Retraction Strengthening;Both  25 reps, palms down, T's & goal posts.     Shoulder Exercises: ROM/Strengthening   UBE (Upper Arm Bike) L2 x 4' alt FWD/BWD     Shoulder Exercises: Stretch   Other Shoulder Stretches pec stretch stretch and upper back stretch  with strap hooked in door.    Other Shoulder Stretches bilat shoulder flex stretch with strap, therapist providing thoracic lift with strap behind back.      Modalities   Modalities Electrical Stimulation;Moist Heat     Moist Heat Therapy   Number Minutes Moist Heat 20 Minutes   Moist Heat Location --  thoracic      Electrical Stimulation   Electrical Stimulation Location Rt thoracic paraspinals   Electrical Stimulation Action IFC   Electrical Stimulation Parameters to tolerance   Electrical Stimulation Goals Tone;Pain     Manual Therapy   Manual Therapy Soft tissue mobilization;Joint mobilization   Joint Mobilization Rt rib springing, no pain   Soft tissue mobilization Rt thoracic paraspinals and rhomboids          Trigger Point Dry Needling - 08/06/16 1116    Consent Given? Yes   Education Handout Provided Yes   Muscles Treated Upper Body Longissimus  Rt T4-9, with stim   Longissimus Response Palpable increased muscle length;Twitch response elicited                   PT Long Term Goals - 08/06/16 1124      PT LONG TERM GOAL #  1   Title Improve sitting/standing posture and alignment with patient to demonstrate imporved upright position. 09/10/16   Status On-going     PT LONG TERM GOAL #2   Title Improve tissue extensibility through Rt thoracic paraspinals 09/10/16   Status On-going     PT LONG TERM GOAL #3   Title Patient will demonstrated and report proper body mechanics in static postures and with transitioinal movements 09/10/16   Status On-going     PT LONG TERM GOAL #4   Title Independent HEP 09/10/16   Status On-going     PT LONG TERM GOAL #5   Title Improve FOTO to </= 20% limitation 09/10/16   Status On-going               Plan - 08/06/16 1147    Clinical Impression Statement This is Robert [name] second visit, he has a good response to TDN, manual therapy and stretching, chest openers.   No goal met.    Rehab Potential Good   PT Frequency 2x /  week   PT Duration 6 weeks   PT Treatment/Interventions Patient/family education;ADLs/Self Care Home Management;Cryotherapy;Electrical Stimulation;Iontophoresis 79m/ml Dexamethasone;Moist Heat;Traction;Ultrasound;Dry needling;Manual techniques;Therapeutic activities;Therapeutic exercise   PT Next Visit Plan assess response to TDN    Consulted and Agree with Plan of Care Patient      Patient will benefit from skilled therapeutic intervention in order to improve the following deficits and impairments:  Postural dysfunction, Improper body mechanics, Pain, Decreased range of motion, Decreased mobility, Increased fascial restricitons, Increased muscle spasms, Decreased activity tolerance  Visit Diagnosis: Acute right-sided thoracic back pain  Other symptoms and signs involving the musculoskeletal system  Abnormal posture     Problem List Patient Active Problem List   Diagnosis Date Noted  . Rhomboid muscle strain 07/22/2016  . Multiple nevi 04/09/2016  . Back pain 02/06/2016  . Abdominal pain, left lower quadrant 02/06/2016  . Left varicocele 02/06/2016    SJeral PinchPT  08/06/2016, 11:48 AM  [facility] [address]Phone: [phone]  Fax:  [phone] Name: Robert MellandMRN: [id number] of Birth: 3January 29, 1982

## 2016-08-09 ENCOUNTER — Encounter: Payer: Self-pay | Admitting: Rehabilitative and Restorative Service Providers"

## 2016-08-09 ENCOUNTER — Ambulatory Visit (INDEPENDENT_AMBULATORY_CARE_PROVIDER_SITE_OTHER): Admitting: Rehabilitative and Restorative Service Providers"

## 2016-08-09 DIAGNOSIS — R29898 Other symptoms and signs involving the musculoskeletal system: Secondary | ICD-10-CM | POA: Diagnosis not present

## 2016-08-09 DIAGNOSIS — R293 Abnormal posture: Secondary | ICD-10-CM

## 2016-08-09 DIAGNOSIS — M546 Pain in thoracic spine: Secondary | ICD-10-CM | POA: Diagnosis not present

## 2016-08-09 NOTE — Patient Instructions (Signed)
Axial Extension (Chin Tuck)    Pull chin in and lengthen back of neck. Hold __10__ seconds while counting out loud. Repeat _5-10___ times. Do _several__ sessions per day.   Scapula Adduction With Pectoralis Stretch: Low - Standing   Shoulders at 45 hands even with shoulders, keeping weight through legs, shift weight forward until you feel pull or stretch through the front of your chest. Hold _30__ seconds. Do _3__ times, _2-4__ times per day.   Scapula Adduction With Pectoralis Stretch: Mid-Range - Standing   Shoulders at 90 elbows even with shoulders, keeping weight through legs, shift weight forward until you feel pull or strength through the front of your chest. Hold __30_ seconds. Do _3__ times, __2-4_ times per day.   Scapula Adduction With Pectoralis Stretch: High - Standing   Shoulders at 120 hands up high on the doorway, keeping weight on feet, shift weight forward until you feel pull or stretch through the front of your chest. Hold _30__ seconds. Do _3__ times, _2-3__ times per day.

## 2016-08-09 NOTE — Therapy (Signed)
Robert [name], [place], [address] Phone: [phone]   Fax:  [phone]  Physical Therapy Treatment  Patient Details  Name: Robert [name] MRN: [id number] Date of Birth: 05/08/1981 Referring Provider: Dr Georgina Snell  Encounter Date: 08/09/2016      PT End of Session - 08/09/16 0808    Visit Number 3   Number of Visits 12   Date for PT Re-Evaluation 09/10/16   PT Start Time 0805   PT Stop Time 0859   PT Time Calculation (min) 54 min   Activity Tolerance Patient tolerated treatment well      History reviewed. No pertinent past medical history.  History reviewed. No pertinent surgical history.  There were no vitals filed for this visit.      Subjective Assessment - 08/09/16 [id number]    Subjective "Tweaked" his neck last night getting a Christmas tree. Stiff and hurts this am. Also feeling the LB and sciatica this morning. Mid back area is "still there" but feels the TDN helped.    Currently in Pain? Yes   Pain Score 4    Pain Location Thoracic   Pain Orientation Right   Pain Descriptors / Indicators Dull;Constant   Pain Type Acute pain   Pain Score 6   Pain Location Neck   Pain Orientation Right;Left   Pain Descriptors / Indicators Sharp   Pain Type Acute pain   Pain Onset Yesterday   Aggravating Factors  moving    Pain Relieving Factors not that he has found                         [facility] Adult PT Treatment/Exercise - 08/09/16 0001      Shoulder Exercises: ROM/Strengthening   UBE (Upper Arm Bike) L2 x 4' alt FWD/BWD     Shoulder Exercises: Stretch   Other Shoulder Stretches 3 way doorway stretch 3 positions 30 sec hold x 2 ea position - difficulty with higher position      Moist Heat Therapy   Number Minutes Moist Heat 20 Minutes   Moist Heat Location Cervical  thoracic      Electrical Stimulation   Electrical Stimulation Location bilat cervical   Electrical  Stimulation Action IFC   Electrical Stimulation Parameters to tolerance   Electrical Stimulation Goals Tone;Pain     Manual Therapy   Manual Therapy Soft tissue mobilization;Joint mobilization   Joint Mobilization cervical PA and lateral glides - most tender and tight at C6/7 area   Soft tissue mobilization bilat cervical paraspinals - ant/lat/post cervical musculature      Neck Exercises: Stretches   Other Neck Stretches chin tuck sitting and supine 10 sec x 5 ea position   Other Neck Stretches cervical rotation and nodding y/n in supine 5 reps ea                 PT Education - 08/09/16 0817    Education provided Yes   Education Details HEP; importance of consistent HEP and spine stabilization    Person(s) Educated Patient   Methods Explanation;Demonstration;Tactile cues;Verbal cues;Handout   Comprehension Verbalized understanding;Returned demonstration;Verbal cues required;Tactile cues required             PT Long Term Goals - 08/06/16 1124      PT LONG TERM GOAL #1   Title Improve sitting/standing posture and alignment with patient to demonstrate imporved upright position. 09/10/16   Status On-going  PT LONG TERM GOAL #2   Title Improve tissue extensibility through Rt thoracic paraspinals 09/10/16   Status On-going     PT LONG TERM GOAL #3   Title Patient will demonstrated and report proper body mechanics in static postures and with transitioinal movements 09/10/16   Status On-going     PT LONG TERM GOAL #4   Title Independent HEP 09/10/16   Status On-going     PT LONG TERM GOAL #5   Title Improve FOTO to </= 20% limitation 09/10/16   Status On-going               Plan - 08/09/16 0811    Clinical Impression Statement Onset of cervical pain last night. Continued thoracic pain with little change. Robert [name] feels the TDN did help the mid back some He is noticing the LBP again today. He has had no time to do HEP.  Noted stiff posture and guarded movement  today. Muscular tightness noted through the ant/lat/post cervical musculature Rt > Lt    Rehab Potential Good   PT Frequency 2x / week   PT Duration 6 weeks   PT Treatment/Interventions Patient/family education;ADLs/Self Care Home Management;Cryotherapy;Electrical Stimulation;Iontophoresis 4mg /ml Dexamethasone;Moist Heat;Traction;Ultrasound;Dry needling;Manual techniques;Therapeutic activities;Therapeutic exercise   PT Next Visit Plan continue TDN as indicated; progress with spine stabilization; modalities as indicated   Consulted and Agree with Plan of Care Patient      Patient will benefit from skilled therapeutic intervention in order to improve the following deficits and impairments:  Postural dysfunction, Improper body mechanics, Pain, Decreased range of motion, Decreased mobility, Increased fascial restricitons, Increased muscle spasms, Decreased activity tolerance  Visit Diagnosis: Acute right-sided thoracic back pain  Other symptoms and signs involving the musculoskeletal system  Abnormal posture     Problem List Patient Active Problem List   Diagnosis Date Noted  . Rhomboid muscle strain 07/22/2016  . Multiple nevi 04/09/2016  . Back pain 02/06/2016  . Abdominal pain, left lower quadrant 02/06/2016  . Left varicocele 02/06/2016    Robert [name] Nilda Simmer PT, MPH  08/09/2016, 8:43 AM  [facility] [place], [place], [address] Phone: [phone]   Fax:  [phone]  Name: Robert [name] MRN: [id number] Date of Birth: 04/20/1981

## 2016-08-14 ENCOUNTER — Ambulatory Visit (INDEPENDENT_AMBULATORY_CARE_PROVIDER_SITE_OTHER): Admitting: Rehabilitative and Restorative Service Providers"

## 2016-08-14 ENCOUNTER — Encounter: Payer: Self-pay | Admitting: Rehabilitative and Restorative Service Providers"

## 2016-08-14 ENCOUNTER — Encounter: Admitting: Rehabilitative and Restorative Service Providers"

## 2016-08-14 DIAGNOSIS — R293 Abnormal posture: Secondary | ICD-10-CM

## 2016-08-14 DIAGNOSIS — M791 Myalgia, unspecified site: Secondary | ICD-10-CM

## 2016-08-14 DIAGNOSIS — M546 Pain in thoracic spine: Secondary | ICD-10-CM | POA: Diagnosis not present

## 2016-08-14 DIAGNOSIS — R29898 Other symptoms and signs involving the musculoskeletal system: Secondary | ICD-10-CM

## 2016-08-14 NOTE — Patient Instructions (Signed)
Resisted External Rotation: in Neutral - Bilateral   PALMS UP Sit or stand, tubing in both hands, elbows at sides, bent to 90, forearms forward. Pinch shoulder blades together and rotate forearms out. Keep elbows at sides. Repeat __10__ times per set. Do _2-3___ sets per session. Do _2-3___ sessions per day.   Low Row: Standing   Face anchor, feet shoulder width apart. Palms up, pull arms back, squeezing shoulder blades together. Repeat 10__ times per set. Do 2-3__ sets per session. Do 2-3__ sessions per week. Anchor Height: Waist     Strengthening: Resisted Extension   Hold tubing in right hand, arm forward. Pull arm back, elbow straight. Repeat _10___ times per set. Do 2-3____ sets per session. Do 2-3____ sessions per day.  Cat / Cow Flow    Inhale, press spine toward ceiling like a Halloween cat. Keeping strength in arms and abdominals, exhale to soften spine through neutral and into cow pose. Open chest and arch back. Initiate movement between cat and cow at tailbone, one vertebrae at a time. Repeat __5__ times.   . Child Pose    Sitting on knees, fold body over legs and relax head and arms on floor. Hold for ___30 sec.

## 2016-08-14 NOTE — Therapy (Signed)
[place], [place], [address] Phone: [phone]   Fax:  [phone]  Physical Therapy Treatment  Patient Details  Name: Robert [name] MRN: [id number] Date of Birth: 10-22-1980 Referring Provider: Dr Georgina Snell  Encounter Date: 08/14/2016      PT End of Session - 08/14/16 0907    Visit Number 4   Number of Visits 12   Date for PT Re-Evaluation 09/10/16   PT Start Time 0848   PT Stop Time 0941   PT Time Calculation (min) 53 min   Activity Tolerance Patient tolerated treatment well      History reviewed. No pertinent past medical history.  History reviewed. No pertinent surgical history.  There were no vitals filed for this visit.      Subjective Assessment - 08/14/16 0907    Subjective Neck is feeling a bit better. On antiinflammatory which has helped. The place in the mid back is still sore.    Currently in Pain? Yes   Pain Score 4    Pain Location Thoracic   Pain Orientation Right   Pain Descriptors / Indicators Dull;Constant                         [facility] Adult PT Treatment/Exercise - 08/14/16 0001      Therapeutic Activites    Therapeutic Activities --  TP release work T-spine      Shoulder Exercises: Supine   Other Supine Exercises over foam roller in thoracic area.   along spine and across spine; TP release with ball T-spine   Other Supine Exercises median nerve mobilization 1 min x 2 reps each UE      Shoulder Exercises: Prone   Other Prone Exercises quadraped cat cow x 5; childs pose x 3      Shoulder Exercises: Standing   Extension Strengthening;Both;20 reps;Theraband   Theraband Level (Shoulder Extension) Level 3 (Green)   Row Strengthening;Both;20 reps;Theraband   Theraband Level (Shoulder Row) Level 3 (Green)   Retraction Strengthening;Both;20 reps;Theraband   Theraband Level (Shoulder Retraction) Level 3 (Green)   Other Standing Exercises scap  squeeze 10 sec x 10 with swim noodle      Shoulder Exercises: ROM/Strengthening   UBE (Upper Arm Bike) L2 x 6' alt FWD/BWD     Shoulder Exercises: Stretch   Other Shoulder Stretches 3 way doorway stretch 3 positions 30 sec hold x 2 ea position - difficulty with higher position      Moist Heat Therapy   Number Minutes Moist Heat 20 Minutes   Moist Heat Location Cervical  thoracic     Electrical Stimulation   Electrical Stimulation Location Rt thoracic   Electrical Stimulation Action IFC   Electrical Stimulation Parameters to tolerance   Electrical Stimulation Goals Tone;Pain     Manual Therapy   Manual Therapy Soft tissue mobilization;Joint mobilization   Joint Mobilization Rt thoracic PA and lat rt to Lt mobs; rib springing    Soft tissue mobilization Rt thoracic musculature - paraspinals; rhomboids; teveator           Trigger Point Dry Needling - 08/14/16 0931    Consent Given? Yes   Muscles Treated Upper Body Longissimus;Rhomboids   Rhomboids Response Palpable increased muscle length;Twitch response elicited   Longissimus Response Palpable increased muscle length              PT Education - 08/14/16 0942    Education provided Yes  Education Details HEP TP release T-spine    Person(s) Educated Patient   Methods Explanation;Demonstration;Tactile cues;Verbal cues;Handout   Comprehension Verbalized understanding;Returned demonstration;Verbal cues required;Tactile cues required             PT Long Term Goals - 08/14/16 0948      PT LONG TERM GOAL #1   Title Improve sitting/standing posture and alignment with patient to demonstrate imporved upright position. 09/10/16   Time 6   Period Weeks   Status On-going     PT LONG TERM GOAL #2   Title Improve tissue extensibility through Rt thoracic paraspinals 09/10/16   Time 6   Period Weeks   Status On-going     PT LONG TERM GOAL #3   Title Patient will demonstrated and report proper body mechanics in static  postures and with transitioinal movements 09/10/16   Time 6   Period Weeks   Status On-going     PT LONG TERM GOAL #4   Title Independent HEP 09/10/16   Time 6   Period Weeks     PT LONG TERM GOAL #5   Title Improve FOTO to </= 20% limitation 09/10/16   Time 6   Period Weeks   Status On-going               Plan - 08/14/16 0943    Clinical Impression Statement Patient reports improvement in cervical spine but continued thoracic pain. Responded well to TDN and release work/manual work. Cotninued tightness noted to Rt thoracic musculature.    Rehab Potential Good   PT Frequency 2x / week   PT Duration 6 weeks   PT Treatment/Interventions Patient/family education;ADLs/Self Care Home Management;Cryotherapy;Electrical Stimulation;Iontophoresis 4mg /ml Dexamethasone;Moist Heat;Traction;Ultrasound;Dry needling;Manual techniques;Therapeutic activities;Therapeutic exercise   PT Next Visit Plan continue TDN as indicated; progress with spine stabilization; modalities as indicated   Consulted and Agree with Plan of Care Patient      Patient will benefit from skilled therapeutic intervention in order to improve the following deficits and impairments:  Postural dysfunction, Improper body mechanics, Pain, Decreased range of motion, Decreased mobility, Increased fascial restricitons, Increased muscle spasms, Decreased activity tolerance  Visit Diagnosis: Acute right-sided thoracic back pain  Other symptoms and signs involving the musculoskeletal system  Abnormal posture  Myalgia     Problem List Patient Active Problem List   Diagnosis Date Noted  . Rhomboid muscle strain 07/22/2016  . Multiple nevi 04/09/2016  . Back pain 02/06/2016  . Abdominal pain, left lower quadrant 02/06/2016  . Left varicocele 02/06/2016    Robert [name] Robert [name] PT, MPH  08/14/2016, 9:50 AM  [facility] [place], [place], [address] Phone:  [phone]   Fax:  [phone]  Name: Robert [name] MRN: [id number] Date of Birth: 01/29/1981

## 2016-08-19 ENCOUNTER — Ambulatory Visit (INDEPENDENT_AMBULATORY_CARE_PROVIDER_SITE_OTHER): Admitting: Physical Therapy

## 2016-08-19 ENCOUNTER — Encounter: Payer: Self-pay | Admitting: Physical Therapy

## 2016-08-19 DIAGNOSIS — M546 Pain in thoracic spine: Secondary | ICD-10-CM

## 2016-08-19 DIAGNOSIS — R29898 Other symptoms and signs involving the musculoskeletal system: Secondary | ICD-10-CM | POA: Diagnosis not present

## 2016-08-19 DIAGNOSIS — R293 Abnormal posture: Secondary | ICD-10-CM

## 2016-08-19 NOTE — Therapy (Signed)
[facility] [place], [place], [address] Phone: [phone]   Fax:  [phone]  Physical Therapy Treatment  Patient Details  Name: Robert [name] MRN: [id number] Date of Birth: 1981-08-11 Referring Provider: Dr Georgina Snell  Encounter Date: 08/19/2016      PT End of Session - 08/19/16 1509    Visit Number 5   Number of Visits 12   Date for PT Re-Evaluation 09/10/16   PT Start Time 1509   PT Stop Time 1611   PT Time Calculation (min) 62 min   Activity Tolerance Patient tolerated treatment well      History reviewed. No pertinent past medical history.  History reviewed. No pertinent surgical history.  There were no vitals filed for this visit.      Subjective Assessment - 08/19/16 1512    Subjective Pt reports his neck and low back are better, still having some trouble with the thoracic area.  He is able to take a breath without to much pain now.    Patient Stated Goals get rid og mid back pain and tingling into the Rt thumb    Currently in Pain? Yes   Pain Score 3    Pain Location Thoracic   Pain Orientation Right   Pain Descriptors / Indicators Dull   Pain Type Acute pain   Pain Onset More than a month ago   Pain Frequency Intermittent   Aggravating Factors  nothing new   Pain Relieving Factors stretches            [facility] PT Assessment - 08/19/16 0001      Assessment   Medical Diagnosis Rhomboid strain   Referring Provider Dr Georgina Snell   Onset Date/Surgical Date 06/09/16   Hand Dominance Right   Next MD Visit PRN     Sensation   Additional Comments decreased Rt thumb tingling      AROM   Overall AROM  --  reaching behind back and behind head equal bilat.                      [facility] Adult PT Treatment/Exercise - 08/19/16 0001      Shoulder Exercises: Standing   Flexion Strengthening;Both;15 reps  rolling ball up/down wall.      Shoulder Exercises: ROM/Strengthening   UBE (Upper Arm Bike) L3x6' alt FWD/BWD     Shoulder Exercises: Stretch   Other Shoulder Stretches around the clock stetch with wide strap     Modalities   Modalities Electrical Stimulation;Moist Heat     Moist Heat Therapy   Number Minutes Moist Heat 20 Minutes   Moist Heat Location Cervical  thoracic     Electrical Stimulation   Electrical Stimulation Location Rt shoulder complex posterior   Electrical Stimulation Action IFC   Electrical Stimulation Parameters  to tolerance   Electrical Stimulation Goals Tone;Pain     Manual Therapy   Manual Therapy Soft tissue mobilization   Soft tissue mobilization Rt thoracic paraspinals, rhomboids, upper trap and levator          Trigger Point Dry Needling - 08/19/16 1530    Consent Given? Yes   Education Handout Provided No   Muscles Treated Upper Body Levator scapulae;Upper trapezius;Subscapularis  all Rt side   Upper Trapezius Response Palpable increased muscle length;Twitch reponse elicited   Levator Scapulae Response Palpable increased muscle length;Twitch response elicited   Subscapularis Response Palpable increased muscle length;Twitch response elicited  PT Long Term Goals - 08/19/16 1510      PT LONG TERM GOAL #1   Title Improve sitting/standing posture and alignment with patient to demonstrate imporved upright position. 09/10/16   Status On-going     PT LONG TERM GOAL #2   Title Improve tissue extensibility through Rt thoracic paraspinals 09/10/16   Status On-going     PT LONG TERM GOAL #3   Title Patient will demonstrated and report proper body mechanics in static postures and with transitioinal movements 09/10/16   Status On-going     PT LONG TERM GOAL #4   Title Independent HEP 09/10/16   Status On-going     PT LONG TERM GOAL #5   Title Improve FOTO to </= 20% limitation 09/10/16   Status On-going               Plan - 08/19/16 1554    Clinical Impression Statement Robert [name] is having  decreased symptoms and decreased palpable tightness in the Rt rhomboids.  He does have one strip and tightness in the subscap and levator Rt side. Making progress to goals.    Rehab Potential Good   PT Frequency 2x / week   PT Duration 6 weeks   PT Treatment/Interventions Patient/family education;ADLs/Self Care Home Management;Cryotherapy;Electrical Stimulation;Iontophoresis 4mg /ml Dexamethasone;Moist Heat;Traction;Ultrasound;Dry needling;Manual techniques;Therapeutic activities;Therapeutic exercise   PT Next Visit Plan continue TDN as indicated; progress with spine stabilization; modalities as indicated   Consulted and Agree with Plan of Care Patient      Patient will benefit from skilled therapeutic intervention in order to improve the following deficits and impairments:  Postural dysfunction, Improper body mechanics, Pain, Decreased range of motion, Decreased mobility, Increased fascial restricitons, Increased muscle spasms, Decreased activity tolerance  Visit Diagnosis: Acute right-sided thoracic back pain  Other symptoms and signs involving the musculoskeletal system  Abnormal posture     Problem List Patient Active Problem List   Diagnosis Date Noted  . Rhomboid muscle strain 07/22/2016  . Multiple nevi 04/09/2016  . Back pain 02/06/2016  . Abdominal pain, left lower quadrant 02/06/2016  . Left varicocele 02/06/2016    Jeral Pinch PT  08/19/2016, 3:57 PM  [facility] [place], [place], [address] Phone: [phone]   Fax:  [phone]  Name: Robert [name] MRN: [id number] Date of Birth: [birth year]

## 2016-08-20 ENCOUNTER — Ambulatory Visit (INDEPENDENT_AMBULATORY_CARE_PROVIDER_SITE_OTHER): Admitting: Family Medicine

## 2016-08-20 ENCOUNTER — Encounter: Payer: Self-pay | Admitting: Family Medicine

## 2016-08-20 VITALS — BP 146/73 | HR 71 | Wt 247.0 lb

## 2016-08-20 DIAGNOSIS — L918 Other hypertrophic disorders of the skin: Secondary | ICD-10-CM

## 2016-08-20 NOTE — Patient Instructions (Signed)
Thank you for coming in today. Return as needed.   Cryosurgery for Skin Conditions, Care After Refer to this sheet in the next few weeks. These instructions provide you with information on caring for yourself after your procedure. Your health care provider may also give you more specific instructions. Your treatment has been planned according to current medical practices, but problems sometimes occur. Call your health care provider if you have any problems or questions after your procedure. WHAT TO EXPECT AFTER THE PROCEDURE After your procedure, it is typical to have the following:  The treated area will become red and swollen shortly after the procedure.  Within 2-3 days, a blister will form over the treated area. The blister may contain a small amount of blood.  In about 2 weeks, the blister will break on its own, leaving a scab. The treated area will then heal. After healing, there is usually little or no scarring. HOME CARE INSTRUCTIONS   Keep the treated area clean, dry, and covered with a bandage until healed. The area can be cleaned as usual with soap and water.  You may take showers. If your bandage gets wet, change it right away.  Do not pick at your blister or try to break it open. This can cause infection and scarring.  Do not apply any medicine, cream, or lotion to the treated area unless directed to do so by your health care provider. SEEK MEDICAL CARE IF:   You have increased pain, swelling, redness, fluid drainage, or bleeding in the treated area.  Your blister becomes large and painful. This information is not intended to replace advice given to you by your health care provider. Make sure you discuss any questions you have with your health care provider. Document Released: 03/15/2005 Document Revised: 04/28/2013 Document Reviewed: 03/26/2013 Elsevier Interactive Patient Education  2017 [employer].

## 2016-08-20 NOTE — Progress Notes (Signed)
       Robert [name] is a 35 y.o. male who presents to [facility] today for Skin Tags.   Patient has a several month history of painful irritated skin tags in his left groin. He has not tried any treatment yet. He notes the skin tags rub and are irritated. He feels well otherwise with no fevers or chills.   No past medical history on file. No past surgical history on file. Social History  Substance Use Topics  . Smoking status: Current Every Day Smoker    Packs/day: 0.50    Years: 10.00  . Smokeless tobacco: Never Used  . Alcohol use Yes   family history includes Cancer in his father.  ROS as above:  Medications: Current Outpatient Prescriptions  Medication Sig Dispense Refill  . cyclobenzaprine (FLEXERIL) 10 MG tablet Take 1 tablet (10 mg total) by mouth at bedtime. 30 tablet 0  . polyethylene glycol powder (GLYCOLAX/MIRALAX) powder Take 17 g by mouth daily. 850 g 1   No current facility-administered medications for this visit.    No Known Allergies  Health Maintenance Health Maintenance  Topic Date Due  . TETANUS/TDAP  09/09/2020  . INFLUENZA VACCINE  Completed  . HIV Screening  Addressed     Exam:  BP (!) 146/73   Pulse 71   Wt 247 lb (112 kg)   BMI 29.29 kg/m  Gen: Well NAD Skin: 4 small skin tags left proximal groin. None are tender irritated.  Cryosurgery: Consent obtained and timeout performed. 4 skin tags were treated 3 separate times with liquid nitrogen therapy to achieve good frost for 15 seconds per treatment.   No results found for this or any previous visit (from the past 72 hour(s)). No results found.    Assessment and Plan: 35 y.o. male with irritated skin tags. Treated with cryosurgery today. Discussed precautions. Return as needed.   No orders of the defined types were placed in this encounter.   Discussed warning  signs or symptoms. Please see discharge instructions. Patient expresses understanding.

## 2016-08-28 ENCOUNTER — Ambulatory Visit (INDEPENDENT_AMBULATORY_CARE_PROVIDER_SITE_OTHER): Admitting: Physical Therapy

## 2016-08-28 ENCOUNTER — Encounter: Payer: Self-pay | Admitting: Physical Therapy

## 2016-08-28 DIAGNOSIS — M545 Low back pain, unspecified: Secondary | ICD-10-CM

## 2016-08-28 DIAGNOSIS — R29898 Other symptoms and signs involving the musculoskeletal system: Secondary | ICD-10-CM | POA: Diagnosis not present

## 2016-08-28 DIAGNOSIS — R293 Abnormal posture: Secondary | ICD-10-CM

## 2016-08-28 DIAGNOSIS — M791 Myalgia, unspecified site: Secondary | ICD-10-CM

## 2016-08-28 DIAGNOSIS — M546 Pain in thoracic spine: Secondary | ICD-10-CM | POA: Diagnosis not present

## 2016-08-28 NOTE — Therapy (Signed)
[place] [place], [place], [address] Phone: [phone]   Fax:  [phone]  Physical Therapy Treatment  Patient Details  Name: Laquan Beier MRN: [id number] Date of Birth: 1980-10-10 Referring Provider: Dr Georgina Snell  Encounter Date: 08/28/2016      PT End of Session - 08/28/16 0807    Visit Number 6   Number of Visits 12   Date for PT Re-Evaluation 09/10/16   PT Start Time 0807   PT Stop Time 0851   PT Time Calculation (min) 44 min   Activity Tolerance Patient tolerated treatment well      History reviewed. No pertinent past medical history.  History reviewed. No pertinent surgical history.  There were no vitals filed for this visit.      Subjective Assessment - 08/28/16 0807    Subjective Pt reports he is doing better, only has pain in the Rt rhomboids with inhalation , no more tingling in the thumb   Patient Stated Goals get rid og mid back pain and tingling into the Rt thumb    Currently in Pain? No/denies                         [facility] Adult PT Treatment/Exercise - 08/28/16 0001      Shoulder Exercises: Prone   Other Prone Exercises fitter, 2 blue bands, side/side 2x10      Shoulder Exercises: Sidelying   Other Sidelying Exercises upper body rotations with blue band 3x10      Shoulder Exercises: ROM/Strengthening   UBE (Upper Arm Bike) L3x6' alt FWD/BWD     Modalities   Modalities Ultrasound     Ultrasound   Ultrasound Location Rt rhomboid   Ultrasound Parameters combo, us/stim, 100%, 1.7mz, 1.5w/cm2   Ultrasound Goals Pain  tone     Manual Therapy   Manual Therapy Soft tissue mobilization   Soft tissue mobilization rt rhomboids, TPR          Trigger Point Dry Needling - 08/28/16 0854    Consent Given? Yes   Education Handout Provided No   Muscles Treated Upper Body Rhomboids  Rt, rib block   Rhomboids Response Palpable increased muscle  length;Twitch response elicited                   PT Long Term Goals - 08/28/16 0817      PT LONG TERM GOAL #1   Title Improve sitting/standing posture and alignment with patient to demonstrate imporved upright position. 09/10/16   Status Achieved     PT LONG TERM GOAL #2   Title Improve tissue extensibility through Rt thoracic paraspinals 09/10/16   Status Achieved     PT LONG TERM GOAL #3   Title Patient will demonstrated and report proper body mechanics in static postures and with transitioinal movements 09/10/16   Status Achieved     PT LONG TERM GOAL #4   Title Independent HEP 09/10/16   Status Achieved     PT LONG TERM GOAL #5   Title Improve FOTO to </= 20% limitation 09/10/16   Status Achieved  15% limited               Plan - 08/28/16 0855    Clinical Impression Statement DDarvishas done very well with therapy, all goals met, he is ready for discharge to HEP.  He has slight discomfort in one area of the Rt rhomboids with deep inhalations  due to a small trigger point present.  This should continue to improve wiht HEP    PT Next Visit Plan discharge   Consulted and Agree with Plan of Care Patient      Patient will benefit from skilled therapeutic intervention in order to improve the following deficits and impairments:     Visit Diagnosis: Acute right-sided thoracic back pain  Other symptoms and signs involving the musculoskeletal system  Abnormal posture  Myalgia  Acute right-sided low back pain without sciatica     Problem List Patient Active Problem List   Diagnosis Date Noted  . Skin tags, multiple acquired 08/20/2016  . Rhomboid muscle strain 07/22/2016  . Multiple nevi 04/09/2016  . Back pain 02/06/2016  . Abdominal pain, left lower quadrant 02/06/2016  . Left varicocele 02/06/2016    Jeral Pinch PT  08/28/2016, 8:57 AM  [facility] [place], [place],  [address] Phone: [phone]   Fax:  [phone]  Name: Deago Burruss MRN: [id number] Date of Birth: Aug 31, 1981  PHYSICAL THERAPY DISCHARGE SUMMARY  Visits from Start of Care: 6  Current functional level related to goals / functional outcomes: See above   Remaining deficits: none   Education / Equipment: HEP Plan: Patient agrees to discharge.  Patient goals were met. Patient is being discharged due to meeting the stated rehab goals.  ?????    Jeral Pinch, PT 08/28/16 8:59 AM

## 2016-12-31 ENCOUNTER — Emergency Department (INDEPENDENT_AMBULATORY_CARE_PROVIDER_SITE_OTHER)

## 2016-12-31 ENCOUNTER — Encounter: Payer: Self-pay | Admitting: *Deleted

## 2016-12-31 ENCOUNTER — Emergency Department (INDEPENDENT_AMBULATORY_CARE_PROVIDER_SITE_OTHER)
Admission: EM | Admit: 2016-12-31 | Discharge: 2016-12-31 | Disposition: A | Discharge status: Home / Self Care | Source: Home / Self Care | Attending: Family Medicine | Admitting: Family Medicine

## 2016-12-31 DIAGNOSIS — R110 Nausea: Secondary | ICD-10-CM | POA: Diagnosis not present

## 2016-12-31 DIAGNOSIS — K760 Fatty (change of) liver, not elsewhere classified: Secondary | ICD-10-CM | POA: Diagnosis not present

## 2016-12-31 LAB — COMPREHENSIVE METABOLIC PANEL
ALT: 43 U/L (ref 9–46)
AST: 29 U/L (ref 10–40)
Albumin: 4.4 g/dL (ref 3.6–5.1)
Alkaline Phosphatase: 79 U/L (ref 40–115)
BILIRUBIN TOTAL: 0.7 mg/dL (ref 0.2–1.2)
BUN: 16 mg/dL (ref 7–25)
CALCIUM: 9.6 mg/dL (ref 8.6–10.3)
CO2: 30 mmol/L (ref 20–31)
CREATININE: 1.04 mg/dL (ref 0.60–1.35)
Chloride: 104 mmol/L (ref 98–110)
GLUCOSE: 102 mg/dL — AB (ref 65–99)
Potassium: 4.4 mmol/L (ref 3.5–5.3)
SODIUM: 139 mmol/L (ref 135–146)
Total Protein: 6.6 g/dL (ref 6.1–8.1)

## 2016-12-31 LAB — AMYLASE: Amylase: 44 U/L (ref 0–105)

## 2016-12-31 LAB — POCT CBC W AUTO DIFF (K'VILLE URGENT CARE)

## 2016-12-31 LAB — LIPASE: Lipase: 30 U/L (ref 7–60)

## 2016-12-31 MED ORDER — ONDANSETRON 4 MG PO TBDP
4.0000 mg | ORAL_TABLET | Freq: Once | ORAL | Status: AC
  Administered 2016-12-31: 4 mg via ORAL

## 2016-12-31 MED ORDER — OMEPRAZOLE 40 MG PO CPDR: 40.0000 mg | DELAYED_RELEASE_CAPSULE | Freq: Every day | ORAL | 1 refills | Status: DC

## 2016-12-31 MED ORDER — ONDANSETRON 4 MG PO TBDP: ORAL_TABLET | ORAL | 0 refills | Status: DC

## 2016-12-31 NOTE — ED Provider Notes (Signed)
[name]    CSN: [id number] Arrival date & time: 12/31/16  1050     History   Chief Complaint Chief Complaint  Patient presents with  . Nausea    HPI Robert [name] is a 36 y.o. male.   Patient complains of one week history of recurring nausea (without vomiting), usually in the morning.  He denies abdominal pain, and nausea is not affected by eating.  He has had occasional heartburn in the past that improves with Prilosec OTC.  Prilosec has not been helpful during the past several days.  His bowel movements have been normal.   He denies significant alcohol use.   The history is provided by the patient.    History reviewed. No pertinent past medical history.  Patient Active Problem List   Diagnosis Date Noted  . Skin tags, multiple acquired 08/20/2016  . Rhomboid muscle strain 07/22/2016  . Multiple nevi 04/09/2016  . Back pain 02/06/2016  . Abdominal pain, left lower quadrant 02/06/2016  . Left varicocele 02/06/2016    History reviewed. No pertinent surgical history.     Home Medications    Prior to Admission medications   Medication Sig Start Date End Date Taking? Authorizing Provider  omeprazole (PRILOSEC) 40 MG capsule Take 1 capsule (40 mg total) by mouth daily. Take 30 minutes before a meal. 12/31/16   Kandra Nicolas, MD  ondansetron (ZOFRAN ODT) 4 MG disintegrating tablet Take one tab by mouth Q6hr prn nausea.  Dissolve under tongue. 12/31/16   Kandra Nicolas, MD    Family History Family History  Problem Relation Age of Onset  . Cancer Father     prostate    Social History Social History  Substance Use Topics  . Smoking status: Current Every Day Smoker    Packs/day: 0.50    Years: 10.00  . Smokeless tobacco: Never Used  . Alcohol use Yes     Allergies   Patient has no known allergies.   Review of Systems Review of Systems  Constitutional: Negative.   HENT: Negative.   Eyes: Negative.   Cardiovascular: Negative.     Gastrointestinal: Positive for nausea. Negative for abdominal distention, abdominal pain, blood in stool, constipation, diarrhea and vomiting.  Genitourinary: Negative.   Musculoskeletal: Negative.   Skin: Negative.   All other systems reviewed and are negative.    Physical Exam Triage Vital Signs ED Triage Vitals  Enc Vitals Group     BP 12/31/16 1108 117/75     Pulse Rate 12/31/16 1108 (!) 58     Resp 12/31/16 1108 16     Temp 12/31/16 1108 97.7 F (36.5 C)     Temp Source 12/31/16 1108 Oral     SpO2 12/31/16 1108 97 %     Weight 12/31/16 1108 246 lb (111.6 kg)     Height 12/31/16 1108 6\' 5"  (1.956 m)     Head Circumference --      Peak Flow --      Pain Score 12/31/16 1109 0     Pain Loc --      Pain Edu? --      Excl. in [place]? --    No data found.   Updated Vital Signs BP 117/75 (BP Location: Left Arm)   Pulse (!) 58   Temp 97.7 F (36.5 C) (Oral)   Resp 16   Ht 6\' 5"  (1.956 m)   Wt 246 lb (111.6 kg)   SpO2 97%   BMI  29.17 kg/m   Visual Acuity Right Eye Distance:   Left Eye Distance:   Bilateral Distance:    Right Eye Near:   Left Eye Near:    Bilateral Near:     Physical Exam  Constitutional: He appears well-developed and well-nourished. No distress.  HENT:  Head: Normocephalic.  Right Ear: External ear normal.  Left Ear: External ear normal.  Nose: Nose normal.  Mouth/Throat: Oropharynx is clear and moist.  Eyes: Conjunctivae are normal. Pupils are equal, round, and reactive to light.  Neck: Neck supple.  Cardiovascular: Normal heart sounds.   Pulmonary/Chest: Breath sounds normal.  Abdominal: Soft. Bowel sounds are normal. He exhibits no distension and no mass. There is no hepatosplenomegaly. There is tenderness in the right upper quadrant and right lower quadrant. There is tenderness at McBurney's point and positive Murphy's sign. There is no rebound and no guarding.    Musculoskeletal: He exhibits no edema.  Lymphadenopathy:    He has no  cervical adenopathy.  Neurological: He is alert.  Skin: Skin is warm and dry. No rash noted.  Nursing note and vitals reviewed.    UC Treatments / Results  Labs (all labs ordered are listed, but only abnormal results are displayed) Labs Reviewed  COMPREHENSIVE METABOLIC PANEL  AMYLASE  LIPASE  POCT CBC W AUTO DIFF ([facility]):  WBC 5.0; LY 43.3; MO 6.6; GR 50.1; Hgb 16.9; Hct 51.4;  Platelets 140     EKG  EKG Interpretation None       Radiology US Abdomen Limited Ruq  Result Date: 12/31/2016 CLINICAL DATA:  Right upper quadrant tenderness on exam today. Nausea for 1 week. EXAM: US ABDOMEN LIMITED - RIGHT UPPER QUADRANT COMPARISON:  None FINDINGS: Gallbladder: Contracted.  No stones.  No wall thickening. Common bile duct: Diameter: 3.7 cm Liver: Diffusely increased echogenicity.  No mass or focal lesion. IMPRESSION: 1. No acute findings. No evidence of acute cholecystitis. Gallbladder is contracted. 2. Hepatic steatosis. Electronically Signed   By: Lajean Manes M.D.   On: 12/31/2016 12:15    Procedures Procedures (including critical care time)  Medications Ordered in UC Medications  ondansetron (ZOFRAN-ODT) disintegrating tablet 4 mg (4 mg Oral Given 12/31/16 1111)     Initial Impression / Assessment and Plan / UC Course  I have reviewed the triage vital signs and the nursing notes.  Pertinent labs & imaging results that were available during my care of the patient were reviewed by me and considered in my medical decision making (see chart for details).    Normal WBC reassuring. ?GERD Amylase, lipase, CMP pending. Begin trial of omeprazole 40mg  daily. Recommend further evaluation of hepatic steatosis:  E.g. Anti-hep C virus antibody, Hepatitis A IgG, Hep B studies, plasma iron ferritin and TIBC, etc. Followup with Family Doctor.    Final Clinical Impressions(s) / UC Diagnoses   Final diagnoses:  Nausea  Hepatic steatosis    New Prescriptions New  Prescriptions   OMEPRAZOLE (PRILOSEC) 40 MG CAPSULE    Take 1 capsule (40 mg total) by mouth daily. Take 30 minutes before a meal.   ONDANSETRON (ZOFRAN ODT) 4 MG DISINTEGRATING TABLET    Take one tab by mouth Q6hr prn nausea.  Dissolve under tongue.     Kandra Nicolas, MD 12/31/16 1246

## 2016-12-31 NOTE — ED Triage Notes (Addendum)
Pt c/o nausea x 1 wk. Denies vomiting or diarrhea. He takes Prilosec prn.

## 2016-12-31 NOTE — Discharge Instructions (Signed)
If symptoms become significantly worse during the night or over the weekend, proceed to the local emergency room.  

## 2017-01-01 ENCOUNTER — Telehealth: Payer: Self-pay | Admitting: Emergency Medicine

## 2017-01-03 ENCOUNTER — Ambulatory Visit (INDEPENDENT_AMBULATORY_CARE_PROVIDER_SITE_OTHER): Admitting: Family Medicine

## 2017-01-03 ENCOUNTER — Encounter: Payer: Self-pay | Admitting: Family Medicine

## 2017-01-03 VITALS — BP 101/61 | HR 64 | Ht 77.0 in | Wt 247.0 lb

## 2017-01-03 DIAGNOSIS — R112 Nausea with vomiting, unspecified: Secondary | ICD-10-CM | POA: Diagnosis not present

## 2017-01-03 DIAGNOSIS — R110 Nausea: Secondary | ICD-10-CM | POA: Diagnosis not present

## 2017-01-03 DIAGNOSIS — R197 Diarrhea, unspecified: Secondary | ICD-10-CM

## 2017-01-03 DIAGNOSIS — R7301 Impaired fasting glucose: Secondary | ICD-10-CM | POA: Diagnosis not present

## 2017-01-03 DIAGNOSIS — R1013 Epigastric pain: Secondary | ICD-10-CM | POA: Diagnosis not present

## 2017-01-03 LAB — POCT GLYCOSYLATED HEMOGLOBIN (HGB A1C): HEMOGLOBIN A1C: 6.2

## 2017-01-03 MED ORDER — PROMETHAZINE HCL 25 MG PO TABS: 25.0000 mg | ORAL_TABLET | Freq: Three times a day (TID) | ORAL | 0 refills | Status: DC | PRN

## 2017-01-03 NOTE — Progress Notes (Addendum)
Subjective:    Patient ID: Robert [name], male    DOB: 25-Sep-1980, 36 y.o.   MRN: [id number]  HPI 2 weeks of abdominal cramping.  Having vomiting and diarrhea that started this AM.  Last ate yesterday at [facility].  No fever sweats or chills. He actually went to urgent care about 4 days ago for these symptoms. At that time he was having nausea but no vomiting. It was usually occurring in the mornings. He does report a history of GERD and has used Prilosec OTC in the past. But was not helpful for his current symptoms. White blood cell count was normal at that time. Amylase and lipase were normal. Electrolytes were normal. Liver enzymes were normal. He also had a normal right upper quadrant ultrasound. It only showed hepatic steatosis. No evidence of acute cholelithiasis. He hasn't had any significant weight loss.  Positive family history of diabetes. Mother is a type I and father is a type II.  He does feel like eating makes his pain a little worse. He denies any travel. He denies outdoor camping or streaking from [place] etc. 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 He has not had any belching or heartburn symptoms. Normally he has a stomach of steel and very rarely gets nauseated or sick. He says this is highly unusual for him. He denies any fevers chills or sweats. When he does get some abdominal pain feels more like cramping and it's usually in the epigastric area or right upper quadrant area.    Review of Systems   BP 101/61   Pulse 64   Ht 6\' 5"  (1.956 m)   Wt 247 lb (112 kg)   SpO2 98%   BMI 29.29 kg/m     No Known Allergies  No past medical history on file.  No past surgical history on file.  Social History   Social History  . Marital status: Married    Spouse name: N/A  . Number of children: N/A  . Years of education: N/A   Occupational History  . Not on file.   Social History Main Topics  . Smoking status: Current Every Day Smoker   Packs/day: 0.50    Years: 10.00  . Smokeless tobacco: Never Used  . Alcohol use Yes  . Drug use: No  . Sexual activity: Not on file   Other Topics Concern  . Not on file   Social History Narrative  . No narrative on file    Family History  Problem Relation Age of Onset  . Cancer Father     prostate    Outpatient Encounter Prescriptions as of 01/03/2017  Medication Sig  . omeprazole (PRILOSEC) 40 MG capsule Take 1 capsule (40 mg total) by mouth daily. Take 30 minutes before a meal.  . ondansetron (ZOFRAN ODT) 4 MG disintegrating tablet Take one tab by mouth Q6hr prn nausea.  Dissolve under tongue.   No facility-administered encounter medications on file as of 01/03/2017.           Objective:   Physical Exam  Constitutional: He is oriented to person, place, and time. He appears well-developed and well-nourished.  HENT:  Head: Normocephalic and atraumatic.  Right Ear: External ear normal.  Left Ear: External ear normal.  Nose: Nose normal.  Mouth/Throat: Oropharynx is clear and moist.  TMs and canals are clear.   Eyes: Conjunctivae and EOM are normal. Pupils are equal, round, and reactive to light.  Neck: Neck supple. No thyromegaly present.  Cardiovascular: Normal rate and normal heart sounds.   No carotid bruits.  Pulmonary/Chest: Effort normal and breath sounds normal.  Abdominal: Soft. Bowel sounds are normal. He exhibits no distension and no mass. There is tenderness. There is no rebound and no guarding.  Diffusely tender on exam.  Musculoskeletal: He exhibits no edema.  Lymphadenopathy:    He has no cervical adenopathy.  Neurological: He is alert and oriented to person, place, and time.  Skin: Skin is warm and dry.  Psychiatric: He has a normal mood and affect.       Assessment & Plan:  Abdominal cramping/pain - Will recheck white blood cell count today to see if it's trending upward. No fevers or chills but he has started vomiting and having diarrhea which  he was not having previously. We'll recheck electrolytes. We'll also check a TSH just to rule out thyroid disorder. Continue Zofran will change to Phenergan to see if this works a little better. Also get stool culture as well as C. difficile evaluation since he started having diarrhea. Continue PPI for now.  Impaired fasting glucose-new diagnosis today. Decided to screen him for diabetes. Hemoglobin A1c was 6.2. This is not actively causing his symptoms but did discuss need for dietary changes. Also encouraged follow-up in 6 months with his PCP to recheck and keep an eye on this. His A1c stays in the 6 range and then may want to consider starting metformin early.

## 2017-01-04 LAB — CBC WITH DIFFERENTIAL/PLATELET
BASOS PCT: 0 %
Basophils Absolute: 0 cells/uL (ref 0–200)
EOS ABS: 132 {cells}/uL (ref 15–500)
Eosinophils Relative: 2 %
HCT: 45 % (ref 38.5–50.0)
Hemoglobin: 15.2 g/dL (ref 13.2–17.1)
Lymphocytes Relative: 19 %
Lymphs Abs: 1254 cells/uL (ref 850–3900)
MCH: 30.3 pg (ref 27.0–33.0)
MCHC: 33.8 g/dL (ref 32.0–36.0)
MCV: 89.8 fL (ref 80.0–100.0)
MONOS PCT: 9 %
MPV: 10.8 fL (ref 7.5–12.5)
Monocytes Absolute: 594 cells/uL (ref 200–950)
Neutro Abs: 4620 cells/uL (ref 1500–7800)
Neutrophils Relative %: 70 %
PLATELETS: 135 10*3/uL — AB (ref 140–400)
RBC: 5.01 MIL/uL (ref 4.20–5.80)
RDW: 13.1 % (ref 11.0–15.0)
WBC: 6.6 10*3/uL (ref 3.8–10.8)

## 2017-01-04 LAB — COMPLETE METABOLIC PANEL WITH GFR
AG RATIO: 2.2 ratio (ref 1.0–2.5)
ALK PHOS: 67 U/L (ref 40–115)
ALT: 37 U/L (ref 9–46)
AST: 28 U/L (ref 10–40)
Albumin: 4.1 g/dL (ref 3.6–5.1)
BUN/Creatinine Ratio: 16.2 Ratio (ref 6–22)
BUN: 16 mg/dL (ref 7–25)
CO2: 26 mmol/L (ref 20–31)
Calcium: 8.8 mg/dL (ref 8.6–10.3)
Chloride: 105 mmol/L (ref 98–110)
Creat: 0.99 mg/dL (ref 0.60–1.35)
GFR, Est African American: >89 mL/min (ref >=60)
GFR, Est Non African American: >89 mL/min (ref >=60)
GLUCOSE: 98 mg/dL (ref 65–99)
Globulin: 1.9 g/dL (ref 1.9–3.7)
POTASSIUM: 4.2 mmol/L (ref 3.5–5.3)
Sodium: 141 mmol/L (ref 135–146)
TOTAL PROTEIN: 6 g/dL — AB (ref 6.1–8.1)
Total Bilirubin: 1.2 mg/dL (ref 0.2–1.2)

## 2017-01-04 LAB — C. DIFFICILE GDH AND TOXIN A/B
C. DIFFICILE GDH: NOT DETECTED
C. difficile Toxin A/B: NOT DETECTED

## 2017-01-04 LAB — TSH: TSH: 1.14 mIU/L (ref 0.40–4.50)

## 2017-01-06 NOTE — Addendum Note (Signed)
Addended by: Teddy Spike on: 01/06/2017 07:58 AM   Modules accepted: Orders

## 2017-01-07 ENCOUNTER — Encounter: Payer: Self-pay | Admitting: Family Medicine

## 2017-01-07 DIAGNOSIS — K760 Fatty (change of) liver, not elsewhere classified: Secondary | ICD-10-CM

## 2017-01-07 HISTORY — DX: Fatty (change of) liver, not elsewhere classified: K76.0

## 2017-01-07 LAB — STOOL CULTURE

## 2017-05-08 ENCOUNTER — Encounter: Payer: Self-pay | Admitting: Family Medicine

## 2017-05-08 ENCOUNTER — Ambulatory Visit (INDEPENDENT_AMBULATORY_CARE_PROVIDER_SITE_OTHER): Admitting: Family Medicine

## 2017-05-08 VITALS — BP 132/71 | HR 54 | Wt 244.0 lb

## 2017-05-08 DIAGNOSIS — M7521 Bicipital tendinitis, right shoulder: Secondary | ICD-10-CM

## 2017-05-08 MED ORDER — DICLOFENAC SODIUM 1 % TD GEL: 2.0000 g | Freq: Four times a day (QID) | TRANSDERMAL | 11 refills | Status: DC

## 2017-05-08 NOTE — Progress Notes (Signed)
Robert [name] is a 36 y.o. male who presents to [facility] today for right arm pain.  Patient reports right arm pain for 2 weeks. He notes that the pain began during a strenuous kickboxing class. The pain is sharp and achy. Patient localizes pain to the distal portion of the biceps muscle and deep to the bicep. Patient denies any popping sensation or bruising. Patient has tried stretching, using a TENS unit, and ibuprofen which have helped to alleviate his pain somewhat. Patient reports that fully extending his arm seems to help relieve some of the pain. Patient denies any numbness or tingling, but notes that today he experienced some cramping in his right hand around the thenar eminence.   Patient denies any chest pain, shortness of breath, or changes in bowel movements.    No past medical history on file. No past surgical history on file. Social History  Substance Use Topics  . Smoking status: Current Every Day Smoker    Packs/day: 0.50    Years: 10.00  . Smokeless tobacco: Never Used  . Alcohol use Yes     ROS:  As above   Medications: Current Outpatient Prescriptions  Medication Sig Dispense Refill  . diclofenac sodium (VOLTAREN) 1 % GEL Apply 2 g topically 4 (four) times daily. To affected joint. 100 g 11   No current facility-administered medications for this visit.    No Known Allergies   Exam:  BP 132/71   Pulse (!) 54   Wt 244 lb (110.7 kg)   BMI 28.93 kg/m  General: Well Developed, well nourished, and in no acute distress.  Neuro/Psych: Alert and oriented x3, extra-ocular muscles intact, able to move all 4 extremities, sensation grossly intact. Skin: Warm and dry, no rashes noted.  Respiratory: Not using accessory muscles, speaking in full sentences, trachea midline.  Cardiovascular: Pulses palpable, no extremity edema. Abdomen: Does not appear distended. MSK: Right arm: No gross deformity, swelling, or  bruising Tenderness to deep palpation at distal end of biceps and deep to biceps muscle, biceps tendon palpated and intact Range of motion at elbow is full with flexion and extension, patient is able to fully pronate and supinate against resistance Strength is 5/5 with elbow flexion and extension, strength of supination and pronation are equal bilaterally    No results found for this or any previous visit (from the past 48 hour(s)). No results found.    Assessment and Plan: 36 y.o. male with right arm pain. Given the onset of patient's symptoms and physical exam findings, this is most likely a musculoskeletal strain. He should continue conservative therapy. He was given a prescription for Voltaren gel. He should also attend PT. Patient will monitor symptoms over the next few weeks. If significant improvement does not occur over this time period, additional testing, such as MRI, is indicated.     Orders Placed This Encounter  Procedures  . Ambulatory referral to Physical Therapy    Referral Priority:   Routine    Referral Type:   Physical Medicine    Referral Reason:   Specialty Services Required    Requested Specialty:   Physical Therapy   Meds ordered this encounter  Medications  . diclofenac sodium (VOLTAREN) 1 % GEL    Sig: Apply 2 g topically 4 (four) times daily. To affected joint.    Dispense:  100 g    Refill:  11    Discussed warning signs or symptoms. Please see discharge instructions.  Patient expresses understanding.  I spent 25 minutes with this patient, greater than 50% was face-to-face time counseling regarding The differential diagnosis treatment plan and backup plan.Marland Kitchen

## 2017-05-08 NOTE — Patient Instructions (Signed)
Thank you for coming in today. Attend PT.  Slowly resume activity as tolerated.  If not getting better we will continue workup.  Apply Diclofenac gel to the arm 4x daily.  Continue ibuprofen or aleve.

## 2017-05-14 ENCOUNTER — Ambulatory Visit (INDEPENDENT_AMBULATORY_CARE_PROVIDER_SITE_OTHER): Admitting: Physical Therapy

## 2017-05-14 DIAGNOSIS — M79602 Pain in left arm: Secondary | ICD-10-CM

## 2017-05-14 DIAGNOSIS — R252 Cramp and spasm: Secondary | ICD-10-CM

## 2017-05-14 DIAGNOSIS — M79601 Pain in right arm: Secondary | ICD-10-CM

## 2017-05-14 NOTE — Patient Instructions (Signed)
ELBOW: Biceps - Standing    Standing in doorway, place one hand on wall, elbow straight. Lean forward. Hold _30__ seconds. _2-3__ reps per set, _2-3__ sets per day, _7__ days per week  MEDIAN NERVE: Mobilization IX    Stand with right palm flat on wall, fingers down, elbow bent. Sidestep away from wall, straightening elbow.  Hold for 5-10 seconds Do __1_ sets of _5-10__ repetitions per session. Do _2-3__ sessions per day.    Trigger Point Dry Needling  . What is Trigger Point Dry Needling (DN)? o DN is a physical therapy technique used to treat muscle pain and dysfunction. Specifically, DN helps deactivate muscle trigger points (muscle knots).  o A thin filiform needle is used to penetrate the skin and stimulate the underlying trigger point. The goal is for a local twitch response (LTR) to occur and for the trigger point to relax. No medication of any kind is injected during the procedure.   . What Does Trigger Point Dry Needling Feel Like?  o The procedure feels different for each individual patient. Some patients report that they do not actually feel the needle enter the skin and overall the process is not painful. Very mild bleeding may occur. However, many patients feel a deep cramping in the muscle in which the needle was inserted. This is the local twitch response.   Marland Kitchen How Will I feel after the treatment? o Soreness is normal, and the onset of soreness may not occur for a few hours. Typically this soreness does not last longer than two days.  o Bruising is uncommon, however; ice can be used to decrease any possible bruising.  o In rare cases feeling tired or nauseous after the treatment is normal. In addition, your symptoms may get worse before they get better, this period will typically not last longer than 24 hours.   . What Can I do After My Treatment? o Increase your hydration by drinking more water for the next 24 hours. o You may place ice or heat on the areas treated that  have become sore, however, do not use heat on inflamed or bruised areas. Heat often brings more relief post needling. o You can continue your regular activities, but vigorous activity is not recommended initially after the treatment for 24 hours. o DN is best combined with other physical therapy such as strengthening, stretching, and other therapies.

## 2017-05-14 NOTE — Therapy (Addendum)
[facility] [place], [place], [address] Phone: [phone]   Fax:  [phone]  Physical Therapy Evaluation/Discharge  Patient Details  Name: Robert [name] MRN: [id number] Date of Birth: Jan 10, 1981 Referring Provider: Dr. Lynne Leader  Encounter Date: 05/14/2017      PT End of Session - 05/14/17 1155    Visit Number 1   Number of Visits 6   Date for PT Re-Evaluation 06/25/17   Authorization Type [employer] - no PTA   PT Start Time 1100   PT Stop Time 1132   PT Time Calculation (min) 32 min   Activity Tolerance Patient tolerated treatment well   Behavior During Therapy [facility] for tasks assessed/performed      No past medical history on file.  No past surgical history on file.  There were no vitals filed for this visit.       Subjective Assessment - 05/14/17 1103    Subjective Pt is a 36 y/o male who presents to OPPT for Rt anterior forearm pain in biceps area.  Pt reports pain is on both sides now.  Pt states he's been using heaving punching bag in boxing class and has noticed increased pain x 2 weeks.  Pt reports similar incident 6 months ago which resolved spontaneously.  Pt states after exercise arms will contract and it will be painful to extend elbows.   Limitations House hold activities;Lifting   Patient Stated Goals improve pain   Currently in Pain? Yes   Pain Score 0-No pain  up to 6/10 recently; after exercise 8/10   Pain Location Arm   Pain Orientation Right;Left;Lateral   Pain Descriptors / Indicators Heaviness;Cramping;Sore   Pain Type Acute pain   Pain Radiating Towards into palm (thumb, index and middle finger area)   Pain Onset 1 to 4 weeks ago   Pain Frequency Intermittent   Aggravating Factors  boxing classes   Pain Relieving Factors voltaren, ibuprofen, trying to stretch            [facility] PT Assessment - 05/14/17 1109      Assessment   Medical Diagnosis Rt biceps tendonitis    Referring Provider Dr. Lynne Leader   Onset Date/Surgical Date --  2 weeks ago   Hand Dominance Right   Next MD Visit Oct 2018   Prior Therapy for back previousely     Precautions   Precautions None     Restrictions   Weight Bearing Restrictions No     Balance Screen   Has the patient fallen in the past 6 months No   Has the patient had a decrease in activity level because of a fear of falling?  No   Is the patient reluctant to leave their home because of a fear of falling?  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[employer]; walking most of the time     Observation/Other Assessments   Focus on Therapeutic Outcomes (FOTO)  67 (33% limited; predicted 18% limited)     Posture/Postural Control   Posture/Postural Control Postural limitations   Postural Limitations Rounded Shoulders;Forward head     ROM / Strength   AROM / PROM / Strength Strength     Strength   Strength  Assessment Site Elbow;Forearm;Hand;Wrist   Right/Left Elbow Right;Left   Right Elbow Flexion 4+/5  mild pain   Right Elbow Extension 5/5   Left Elbow Flexion 5/5   Left Elbow Extension 5/5   Right/Left Forearm Right;Left   Right Forearm Pronation 5/5   Right Forearm Supination 5/5   Left Forearm Pronation 5/5   Left Forearm Supination 5/5   Right/Left Wrist Right;Left   Right Wrist Flexion 5/5   Left Wrist Flexion 5/5   Right Hand Grip (lbs) 100.3  104, 105, 92   Left Hand Grip (lbs) 98.67  96, 100, 100     Palpation   Palpation comment trigger points in bil biceps and brachioradialis            Objective measurements completed on examination: See above findings.          [facility] Adult PT Treatment/Exercise - 05/14/17 1109      Self-Care    Self-Care Other Self-Care Comments   Other Self-Care Comments  reviewed DN handout with pt     Exercises   Exercises Elbow     Elbow Exercises   Elbow Flexion Limitations instructed in biceps stretch in doorway   Other elbow exercises median nerve glides x 5 reps standing                PT Education - 05/14/17 1147    Education provided Yes   Education Details HEP   Person(s) Educated Patient   Methods Explanation;Demonstration;Handout   Comprehension Verbalized understanding;Returned demonstration;Need further instruction             PT Long Term Goals - 05/14/17 1159      PT LONG TERM GOAL #1   Title independent with HEP   Time 6   Period Weeks   Status New   Target Date 06/25/17     PT LONG TERM GOAL #2   Title report ability to return to boxing workout x 60 min without symtoms after session   Time 6   Period Weeks   Status New   Target Date 06/25/17     PT LONG TERM GOAL #3   Title report pain < 3/10 after exercise for improved function   Time 6   Period Weeks   Status New   Target Date 06/25/17     PT LONG TERM GOAL #4   Title n/a     PT LONG TERM GOAL #5   Title n/a                Plan - 05/14/17 1155    Clinical Impression Statement Pt is a 36 y/o who presents to OPPT for RUE pain following boxing workouts.  Pt also reports LUE involvement so will address this as well.  Pt demonstrates mild strength deficits and active trigger points.  Feel pt's symptoms are likely due to overuse so discussed decreasing duration and intensity of boxing classes and see how he responds.  May benefit from DN to address trigger points in biceps and brachioradialis.  Will benefit from PT to address deficits.   Clinical Presentation Stable   Clinical Decision Making Low   Rehab Potential Good   PT Frequency 1x / week   PT Duration 6 weeks   PT Treatment/Interventions ADLs/Self Care Home Management;Cryotherapy;Electrical Stimulation;Moist  Heat;Iontophoresis '4mg'$ /ml Dexamethasone;Ultrasound;Therapeutic exercise;Patient/family education;Dry needling;Taping;Manual techniques   PT Next Visit Plan review HEP, see how arms feel after workout at less intensity/duration; possible TDN to biceps/brachioradialis   Consulted and  Agree with Plan of Care Patient      Patient will benefit from skilled therapeutic intervention in order to improve the following deficits and impairments:  Increased fascial restricitons, Increased muscle spasms, Pain, Impaired UE functional use, Decreased strength  Visit Diagnosis: Pain in right arm - Plan: PT plan of care cert/re-cert  Pain in left arm - Plan: PT plan of care cert/re-cert  Cramp and spasm - Plan: PT plan of care cert/re-cert      [facility] PT PB G-CODES - 02-Jun-2017 1201    Functional Assessment Tool Used  FOTO   Functional Limitations Carrying, moving and handling objects   Carrying, Moving and Handling Objects Current Status [phone]) At least 20 percent but less than 40 percent impaired, limited or restricted   Carrying, Moving and Handling Objects Goal Status (W9798) At least 1 percent but less than 20 percent impaired, limited or restricted     Late entry D/C status G-code: CJ Laureen Abrahams, PT, DPT 06/25/17 12:57 PM   Problem List Patient Active Problem List   Diagnosis Date Noted  . Fatty liver 01/07/2017  . Skin tags, multiple acquired 08/20/2016  . Rhomboid muscle strain 07/22/2016  . Multiple nevi 04/09/2016  . Back pain 02/06/2016  . Abdominal pain, left lower quadrant 02/06/2016  . Left varicocele 02/06/2016      Laureen Abrahams, PT, DPT Jun 02, 2017 12:03 PM    [facility] [place], [place], [address] Phone: [phone]   Fax:  [phone]  Name: Robert [name] MRN: [id number] Date of Birth: 1981-02-17      PHYSICAL THERAPY DISCHARGE SUMMARY  Visits from Start of Care:  1  Current functional level related to goals / functional outcomes: See above   Remaining deficits: unknown   Education / Equipment: HEP  Plan: Patient agrees to discharge.  Patient goals were not met. Patient is being discharged due to not returning since the last visit.  ?????    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  Laureen Abrahams, PT, DPT 06/25/17 12:57 PM   Outpatient Rehab at [facility] [place] [address] [place], [place] [address]  [phone] (office) [phone] (fax)

## 2017-05-21 ENCOUNTER — Encounter: Admitting: Rehabilitative and Restorative Service Providers"

## 2017-05-27 ENCOUNTER — Encounter: Admitting: Rehabilitative and Restorative Service Providers"

## 2017-07-07 ENCOUNTER — Ambulatory Visit (INDEPENDENT_AMBULATORY_CARE_PROVIDER_SITE_OTHER): Admitting: Family Medicine

## 2017-07-07 ENCOUNTER — Encounter: Payer: Self-pay | Admitting: Family Medicine

## 2017-07-07 VITALS — BP 109/67 | HR 60 | Wt 249.0 lb

## 2017-07-07 DIAGNOSIS — E119 Type 2 diabetes mellitus without complications: Secondary | ICD-10-CM | POA: Insufficient documentation

## 2017-07-07 DIAGNOSIS — R7303 Prediabetes: Secondary | ICD-10-CM | POA: Diagnosis not present

## 2017-07-07 DIAGNOSIS — K760 Fatty (change of) liver, not elsewhere classified: Secondary | ICD-10-CM

## 2017-07-07 DIAGNOSIS — R7301 Impaired fasting glucose: Secondary | ICD-10-CM

## 2017-07-07 HISTORY — DX: Prediabetes: R73.03

## 2017-07-07 LAB — POCT GLYCOSYLATED HEMOGLOBIN (HGB A1C): Hemoglobin A1C: 5.8

## 2017-07-07 NOTE — Patient Instructions (Addendum)
Thank you for coming in today. Get fasting labs in the near future.  Recheck in 1 year or sooner if needed.  Let me know if anything comes up.

## 2017-07-07 NOTE — Progress Notes (Signed)
       Robert [name] is a 36 y.o. male who presents to [facility]: [facility] today for prediabetes.  Patient was found to have elevated his sleep.  He is here today for recheck A1c.  He notes that he has been eating a lower carbohydrate diet and feels great with no issues.  No chest pain palpitations shortness of breath polyuria or polydipsia.  He notes his father has type 2 diabetes.   Past Medical History:  Diagnosis Date  . Fatty liver 01/07/2017   Seen [place] 12/2016  . Prediabetes 07/07/2017   No past surgical history on file. Social History  Substance Use Topics  . Smoking status: Current Every Day Smoker    Packs/day: 0.50    Years: 10.00  . Smokeless tobacco: Never Used  . Alcohol use Yes   family history includes Cancer in his father.  ROS as above:  Medications: Current Outpatient Prescriptions  Medication Sig Dispense Refill  . diclofenac sodium (VOLTAREN) 1 % GEL Apply 2 g topically 4 (four) times daily. To affected joint. 100 g 11   No current facility-administered medications for this visit.    No Known Allergies  Health Maintenance Health Maintenance  Topic Date Due  . INFLUENZA VACCINE  05/08/2018 (Originally 04/09/2017)  . TETANUS/TDAP  09/09/2020  . HIV Screening  Completed     Exam:  BP 109/67   Pulse 60   Wt 249 lb (112.9 kg)   BMI 29.53 kg/m  Gen: Well NAD HEENT: EOMI,  MMM Lungs: Normal work of breathing. CTABL Heart: RRR no MRG Abd: NABS, Soft. Nondistended, Nontender Exts: Brisk capillary refill, warm and well perfused.    Results for orders placed or performed in visit on 07/07/17 (from the past 72 hour(s))  POCT HgB A1C     Status: None   Collection Time: 07/07/17  9:31 AM  Result Value Ref Range   Hemoglobin A1C 5.8    No results found.    Chemistry      Component Value Date/Time   NA 141 01/03/2017 1418   K 4.2 01/03/2017  1418   CL 105 01/03/2017 1418   CO2 26 01/03/2017 1418   BUN 16 01/03/2017 1418   CREATININE 0.99 01/03/2017 1418      Component Value Date/Time   CALCIUM 8.8 01/03/2017 1418   ALKPHOS 67 01/03/2017 1418   AST 28 01/03/2017 1418   ALT 37 01/03/2017 1418   BILITOT 1.2 01/03/2017 1418       Assessment and Plan: 36 y.o. male with  Prediabetes doing well.  Continue low carbohydrate diet exercise and try to keep the weight off.  Will check a fasting lipid panel in the near future.  Otherwise recheck yearly.   Orders Placed This Encounter  Procedures  . Lipid Panel w/reflex Direct LDL  . POCT HgB A1C   No orders of the defined types were placed in this encounter.    Discussed warning signs or symptoms. Please see discharge instructions. Patient expresses understanding.

## 2017-08-02 ENCOUNTER — Telehealth: Payer: Self-pay | Admitting: Emergency Medicine

## 2017-08-02 ENCOUNTER — Other Ambulatory Visit: Payer: Self-pay

## 2017-08-02 ENCOUNTER — Emergency Department (INDEPENDENT_AMBULATORY_CARE_PROVIDER_SITE_OTHER)

## 2017-08-02 ENCOUNTER — Emergency Department (INDEPENDENT_AMBULATORY_CARE_PROVIDER_SITE_OTHER)
Admission: EM | Admit: 2017-08-02 | Discharge: 2017-08-02 | Disposition: A | Discharge status: Home / Self Care | Source: Home / Self Care | Attending: Family Medicine | Admitting: Family Medicine

## 2017-08-02 ENCOUNTER — Encounter: Payer: Self-pay | Admitting: Emergency Medicine

## 2017-08-02 DIAGNOSIS — M25561 Pain in right knee: Secondary | ICD-10-CM

## 2017-08-02 DIAGNOSIS — M25562 Pain in left knee: Secondary | ICD-10-CM | POA: Diagnosis not present

## 2017-08-02 NOTE — Discharge Instructions (Signed)
Continue wearing knee brace daytime.  Begin Ibuprofen 200mg , 4 tabs every 8 hours with food.  Apply ice pack for 20 to 30 minutes, 3 to 4 times daily  Continue until pain and swelling decrease.  Begin knee exercises as tolerated.

## 2017-08-02 NOTE — Telephone Encounter (Signed)
Rt. Knee xray was ordered incorrectly, Only 4 view Complete was done.

## 2017-08-02 NOTE — ED Triage Notes (Signed)
Right Sided Knee Pain x 3 weeks

## 2017-08-02 NOTE — ED Provider Notes (Signed)
[name]    CSN: [id number] Arrival date & time: 08/02/17  5427     History   Chief Complaint Chief Complaint  Patient presents with  . Knee Pain    HPI Robert [name] is a 36 y.o. male.   After running an playing with his children about 3 weeks ago, patient developed mild pain in the anterior/lateral aspect of his right knee.  The pain has persisted, and is worse when climbing stairs.  His knee does not give way or lock up.   The history is provided by the patient.  Knee Pain  Location:  Knee Time since incident:  3 weeks Injury: yes   Mechanism of injury comment:  Running Knee location:  R knee Pain details:    Quality:  Aching   Radiates to:  Does not radiate   Severity:  Mild   Onset quality:  Gradual   Duration:  3 weeks   Timing:  Constant   Progression:  Unchanged Chronicity:  New Dislocation: no   Prior injury to area:  No Relieved by:  Nothing Worsened by:  Activity, exercise and extension Ineffective treatments: Voltaren gel. Associated symptoms: no back pain, no decreased ROM, no muscle weakness, no numbness, no stiffness, no swelling and no tingling     Past Medical History:  Diagnosis Date  . Fatty liver 01/07/2017   Seen [place] 12/2016  . Prediabetes 07/07/2017    Patient Active Problem List   Diagnosis Date Noted  . Prediabetes 07/07/2017  . Fatty liver 01/07/2017  . Skin tags, multiple acquired 08/20/2016  . Multiple nevi 04/09/2016  . Back pain 02/06/2016  . Left varicocele 02/06/2016    History reviewed. No pertinent surgical history.     Home Medications    Prior to Admission medications   Medication Sig Start Date End Date Taking? Authorizing Provider  diclofenac sodium (VOLTAREN) 1 % GEL Apply 2 g topically 4 (four) times daily. To affected joint. 05/08/17   Gregor Hams, MD    Family History Family History  Problem Relation Age of Onset  . Cancer Father        prostate    Social History Social  History   Tobacco Use  . Smoking status: Current Every Day Smoker    Packs/day: 0.50    Years: 10.00    Pack years: 5.00  . Smokeless tobacco: Never Used  Substance Use Topics  . Alcohol use: Yes  . Drug use: No     Allergies   Patient has no known allergies.   Review of Systems Review of Systems  Musculoskeletal: Negative for back pain and stiffness.  All other systems reviewed and are negative.    Physical Exam Triage Vital Signs ED Triage Vitals  Enc Vitals Group     BP 08/02/17 1004 119/74     Pulse Rate 08/02/17 1004 65     Resp 08/02/17 1004 16     Temp 08/02/17 1004 98 F (36.7 C)     Temp Source 08/02/17 1004 Oral     SpO2 08/02/17 1004 97 %     Weight 08/02/17 1005 240 lb (108.9 kg)     Height 08/02/17 1005 6\' 5"  (1.956 m)     Head Circumference --      Peak Flow --      Pain Score 08/02/17 1005 5     Pain Loc --      Pain Edu? --      Excl. in  GC? --    No data found.  Updated Vital Signs BP 119/74 (BP Location: Left Arm)   Pulse 65   Temp 98 F (36.7 C) (Oral)   Resp 16   Ht 6\' 5"  (1.956 m)   Wt 240 lb (108.9 kg)   SpO2 97%   BMI 28.46 kg/m   Visual Acuity Right Eye Distance:   Left Eye Distance:   Bilateral Distance:    Right Eye Near:   Left Eye Near:    Bilateral Near:     Physical Exam  Constitutional: He appears well-developed and well-nourished. No distress.  HENT:  Head: Normocephalic.  Eyes: Pupils are equal, round, and reactive to light.  Neck: Normal range of motion.  Cardiovascular: Normal rate.  Pulmonary/Chest: Effort normal.  Musculoskeletal:       Right knee: He exhibits normal range of motion, no swelling, no effusion, no ecchymosis, no deformity, no laceration, no erythema, normal alignment and no LCL laxity. Tenderness found. Patellar tendon tenderness noted.       Legs: Right knee:  No effusion, erythema, or warmth.  Knee stable, negative drawer test.  McMurray test negative.  Knee has full range of motion.   There is tenderness to palpation over the iliotibial band and patellar tendon.  Neurological: He is alert.  Skin: Skin is warm and dry.  Nursing note and vitals reviewed.    UC Treatments / Results  Labs (all labs ordered are listed, but only abnormal results are displayed) Labs Reviewed - No data to display  EKG  EKG Interpretation None       Radiology Dg Knee Complete 4 Views Right  Result Date: 08/02/2017 CLINICAL DATA:  Right knee pain after injury 3 weeks ago. EXAM: RIGHT KNEE - COMPLETE 4+ VIEW COMPARISON:  None. FINDINGS: No evidence of fracture, dislocation, or joint effusion. No evidence of arthropathy or other focal bone abnormality. Soft tissues are unremarkable. IMPRESSION: Normal right knee. Electronically Signed   By: Marijo Conception, M.D.   On: 08/02/2017 10:26    Procedures Procedures (including critical care time)  Medications Ordered in UC Medications - No data to display   Initial Impression / Assessment and Plan / UC Course  I have reviewed the triage vital signs and the nursing notes.  Pertinent labs & imaging results that were available during my care of the patient were reviewed by me and considered in my medical decision making (see chart for details).    Symptoms and exam suggest mild patellar tendonitis and iliotibial band syndrome. Continue wearing knee brace daytime.  Begin Ibuprofen 200mg , 4 tabs every 8 hours with food.  Apply ice pack for 20 to 30 minutes, 3 to 4 times daily  Continue until pain and swelling decrease.  Begin knee exercises as tolerated. Followup with Dr. Aundria Mems or Dr. Lynne Leader ([facility]) if not improving about two weeks.     Final Clinical Impressions(s) / UC Diagnoses   Final diagnoses:  Acute pain of left knee    ED Discharge Orders    None           Kandra Nicolas, MD 08/03/17 [phone]

## 2017-08-04 ENCOUNTER — Telehealth: Payer: Self-pay

## 2017-08-04 NOTE — Telephone Encounter (Signed)
Left VM to call UC if any questions or problems.  Contact information Left.

## 2018-01-27 ENCOUNTER — Ambulatory Visit (INDEPENDENT_AMBULATORY_CARE_PROVIDER_SITE_OTHER): Admitting: Family Medicine

## 2018-01-27 ENCOUNTER — Encounter: Payer: Self-pay | Admitting: Family Medicine

## 2018-01-27 VITALS — BP 120/63 | HR 66 | Ht 76.0 in | Wt 237.0 lb

## 2018-01-27 DIAGNOSIS — F17200 Nicotine dependence, unspecified, uncomplicated: Secondary | ICD-10-CM

## 2018-01-27 DIAGNOSIS — IMO0001 Reserved for inherently not codable concepts without codable children: Secondary | ICD-10-CM | POA: Insufficient documentation

## 2018-01-27 MED ORDER — VARENICLINE TARTRATE 1 MG PO TABS: 1.0000 mg | ORAL_TABLET | Freq: Two times a day (BID) | ORAL | 3 refills | Status: DC

## 2018-01-27 MED ORDER — VARENICLINE TARTRATE 0.5 MG X 11 & 1 MG X 42 PO MISC: ORAL | 0 refills | Status: DC

## 2018-01-27 NOTE — Patient Instructions (Signed)
Thank you for coming in today. Restart chantix.  Consider Quit Line.  1-800-QuitNow  Recheck as needed.

## 2018-01-27 NOTE — Progress Notes (Signed)
Robert [name] is a 37 y.o. male who presents to [place]: [place] today for smoking cessation.   Patient is here to get back on Chantix. He has successfully quit smoking with Chantix before. He quit last February and began smoking again this February after stopping Chantix. He smokes half a pack per day on average. He denies any shortness of breath or cough.    ROS as above:  Exam:  BP 120/63   Pulse 66   Ht 6\' 4"  (1.93 m)   Wt 237 lb (107.5 kg)   BMI 28.85 kg/m  Gen: Well NAD HEENT: EOMI,  MMM Lungs: Normal work of breathing. CTABL Heart: RRR no MRG Abd: NABS, Soft. Nondistended, Nontender Exts: Brisk capillary refill, warm and well perfused.   Lab and Radiology Results No results found for this or any previous visit (from the past 72 hour(s)). No results found.   Assessment and Plan: 37 y.o. male with smoking history.   Patient has successfully quit before with Chantix. He did not have any serious side effects, so I believe this is a good option to quit again. We discussed potential triggers and utilizing cessation counseling as well. If he is having trouble quitting, he should contact me to discuss further treatment.    No orders of the defined types were placed in this encounter.  Meds ordered this encounter  Medications  . varenicline (CHANTIX STARTING MONTH PAK) 0.5 MG X 11 & 1 MG X 42 tablet    Sig: Take one 0.5mg  tablet by mouth once daily for 3 days, then increase to one 0.5mg  tablet twice daily for 3 days, then increase to one 1mg  tablet twice daily.    Dispense:  53 tablet    Refill:  0  . varenicline (CHANTIX) 1 MG tablet    Sig: Take 1 tablet (1 mg total) by mouth 2 (two) times daily.    Dispense:  60 tablet    Refill:  3     Historical information moved to improve visibility of documentation.  Past Medical History:  Diagnosis Date    . Fatty liver 01/07/2017   Seen [place] 12/2016  . Prediabetes 07/07/2017   No past surgical history on file. Social History   Tobacco Use  . Smoking status: Current Every Day Smoker    Packs/day: 0.50    Years: 10.00    Pack years: 5.00  . Smokeless tobacco: Never Used  Substance Use Topics  . Alcohol use: Yes   family history includes Cancer in his father.  Medications: Current Outpatient Medications  Medication Sig Dispense Refill  . diclofenac sodium (VOLTAREN) 1 % GEL Apply 2 g topically 4 (four) times daily. To affected joint. 100 g 11  . varenicline (CHANTIX STARTING MONTH PAK) 0.5 MG X 11 & 1 MG X 42 tablet Take one 0.5mg  tablet by mouth once daily for 3 days, then increase to one 0.5mg  tablet twice daily for 3 days, then increase to one 1mg  tablet twice daily. 53 tablet 0  . varenicline (CHANTIX) 1 MG tablet Take 1 tablet (1 mg total) by mouth 2 (two) times daily. 60 tablet 3   No current facility-administered medications for this visit.    No Known Allergies  Health Maintenance Health Maintenance  Topic Date Due  . INFLUENZA VACCINE  05/08/2018 (Originally 04/09/2018)  . TETANUS/TDAP  09/09/2020  . HIV Screening  Completed    Discussed warning signs or symptoms.  Please see discharge instructions. Patient expresses understanding.  I spent 15 minutes with this patient, greater than 50% was face-to-face time counseling regarding ddx and plan.

## 2018-01-29 ENCOUNTER — Telehealth: Payer: Self-pay | Admitting: Family Medicine

## 2018-01-29 NOTE — Telephone Encounter (Signed)
Received a fax from the pharmacy that Chantix starter pack is not covered under patients plan. Pharmacy requests you send the 1mg  dose with the starter pack instructions. Thanks

## 2018-01-29 NOTE — Addendum Note (Signed)
Addended by: Dessie Coma on: 01/29/2018 09:25 AM   Modules accepted: Orders

## 2018-01-30 MED ORDER — VARENICLINE TARTRATE 1 MG PO TABS: ORAL_TABLET | ORAL | 0 refills | Status: AC

## 2018-01-30 NOTE — Telephone Encounter (Signed)
Patient is aware and he voices understanding. He did not have any further questions about the medication change. Updated Rx faxed to [facility] on file and received confirmed transmission.

## 2018-01-30 NOTE — Telephone Encounter (Signed)
Will fax new rx.

## 2018-02-03 NOTE — Telephone Encounter (Signed)
Received a message from [facility] that the Chantix would need to be sent to mail order. Rx re-sent to the [facility].

## 2018-05-05 ENCOUNTER — Encounter: Payer: Self-pay | Admitting: Family Medicine

## 2018-05-05 ENCOUNTER — Telehealth: Payer: Self-pay | Admitting: Family Medicine

## 2018-05-05 NOTE — Telephone Encounter (Signed)
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 He needs a letter from PCP stating he was taking Chantix but that is was discontinued. Dates available per Rx history are: 5824/19-6/21/19.   This Rx is one he is not allowed to have while deployed, so needs note from PCP stating he no longer takes it.   Once letter is written, I will contact his case manager: Cpt Neta Ehlers at [phone] to see where letter is to be sent.

## 2018-05-05 NOTE — Telephone Encounter (Signed)
Task completed

## 2018-05-05 NOTE — Telephone Encounter (Signed)
Letter written. Robert [name] will scan and email letter.

## 2018-05-06 ENCOUNTER — Telehealth: Payer: Self-pay

## 2018-05-06 NOTE — Telephone Encounter (Signed)
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 Patient stated that he cannot be deployed if he is taking this medication. Please advise. Mico Spark,CMA

## 2018-05-07 NOTE — Telephone Encounter (Signed)
Left message on patient vm advising of this information. [place] Dodgen,CMA

## 2018-05-07 NOTE — Telephone Encounter (Signed)
We wrote a letter on 8/27 and gave to wife stating that he is no longer on Chantix.

## 2019-12-17 LAB — CBC AND DIFFERENTIAL
HCT: 49 (ref 41–53)
Hemoglobin: 17.3 (ref 13.5–17.5)
Platelets: 165 (ref 150–399)
WBC: 7.4

## 2019-12-17 LAB — LIPID PANEL
Cholesterol: 214 — AB (ref 0–200)
HDL: 39 (ref 35–70)
LDL Cholesterol: 132
Triglycerides: 214 — AB (ref 40–160)

## 2019-12-17 LAB — HEPATIC FUNCTION PANEL
ALT: 52 — AB (ref 10–40)
AST: 22 (ref 14–40)
Bilirubin, Direct: 0.2 (ref 0.01–0.4)
Bilirubin, Total: 0.6

## 2019-12-17 LAB — BASIC METABOLIC PANEL
BUN: 15 (ref 4–21)
CO2: 30 — AB (ref 13–22)
Chloride: 103 (ref 99–108)
Creatinine: 1 (ref 0.6–1.3)
Glucose: 179
Potassium: 3.7 (ref 3.4–5.3)
Sodium: 138 (ref 137–147)

## 2019-12-17 LAB — CBC: RBC: 5.58 — AB (ref 3.87–5.11)

## 2019-12-17 LAB — COMPREHENSIVE METABOLIC PANEL
Albumin: 3.9 (ref 3.5–5.0)
Calcium: 9.5 (ref 8.7–10.7)

## 2019-12-17 LAB — HEMOGLOBIN A1C: Hemoglobin A1C: 7

## 2020-01-04 ENCOUNTER — Ambulatory Visit (INDEPENDENT_AMBULATORY_CARE_PROVIDER_SITE_OTHER): Admitting: Family Medicine

## 2020-01-04 ENCOUNTER — Other Ambulatory Visit: Payer: Self-pay

## 2020-01-04 ENCOUNTER — Ambulatory Visit: Admitting: Family Medicine

## 2020-01-04 ENCOUNTER — Encounter: Payer: Self-pay | Admitting: Family Medicine

## 2020-01-04 VITALS — BP 112/77 | HR 71 | Wt 236.0 lb

## 2020-01-04 DIAGNOSIS — F17200 Nicotine dependence, unspecified, uncomplicated: Secondary | ICD-10-CM | POA: Diagnosis not present

## 2020-01-04 DIAGNOSIS — E119 Type 2 diabetes mellitus without complications: Secondary | ICD-10-CM

## 2020-01-04 DIAGNOSIS — N442 Benign cyst of testis: Secondary | ICD-10-CM | POA: Diagnosis not present

## 2020-01-04 MED ORDER — CHANTIX STARTING MONTH PAK 0.5 MG X 11 & 1 MG X 42 PO TABS: ORAL_TABLET | ORAL | 0 refills | Status: DC

## 2020-01-04 NOTE — Progress Notes (Signed)
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 [facility] was [facility] and A1C was high.  [facility]: A1C = 7.0  Benign Cysts 2 benign cysts found on left testicle. Causing pain. [facility] won't [facility] about them.   Smoking He requested Chantix from [place]. Insurance cleared the order. Medication has not arrived. His insurance will be changing from [place] on 01/24/20.

## 2020-01-04 NOTE — Patient Instructions (Signed)

## 2020-01-09 DIAGNOSIS — N442 Benign cyst of testis: Secondary | ICD-10-CM | POA: Insufficient documentation

## 2020-01-09 NOTE — Assessment & Plan Note (Signed)
He would like to try 3 month trial of lifestyle change.  Discussed low carb diet. Recommend he have annual eye exam.  He will do this through [place].

## 2020-01-09 NOTE — Assessment & Plan Note (Signed)
Having some increased testicular pain. Urology referral entered.

## 2020-01-09 NOTE — Assessment & Plan Note (Signed)
He is interested in quitting.  Will send in rx for chantix.  Side effects reviewed with him.

## 2020-01-09 NOTE — Progress Notes (Signed)
Robert [name] - 39 y.o. male MRN [id number]  Date of birth: [birth year]  Subjective Chief Complaint  Patient presents with  . Diabetes    HPI Robert [name] is a 39 y.o. male with history of prediabetes and nicotine dependence here today to review recent labs he had completed at [place].    -Diabetes: Recent A1c through [place] was 7.0%.  He was not started on any treatment and recommended to try changes to diet and exercise which he has already started.  He denies any symptoms including fatigue, increased thirst or urination.   -Testicular pain:  Having some recurrent testicular pain.  Had [place] through [place] previously and told that he had benign cysts.  These have become increasingly painful and he would like to urology about what can be done about this.    -Nicotine dependence:  He was supposed to have been prescribed chantix through the [place] however has waited several weeks and still has not received prescription.  [facility]   ROS:  A comprehensive ROS was completed and negative except as noted per HPI  No Known Allergies  Past Medical History:  Diagnosis Date  . Fatty liver 01/07/2017   Seen [place] 12/2016  . Prediabetes 07/07/2017    History reviewed. No pertinent surgical history.  Social History   Socioeconomic History  . Marital status: Married    Spouse name: Not on file  . Number of children: Not on file  . Years of education: Not on file  . Highest education level: Not on file  Occupational History  . Not on file  Tobacco Use  . Smoking status: Current Every Day Smoker    Packs/day: 0.50    Years: 10.00    Pack years: 5.00  . Smokeless tobacco: Never Used  Substance and Sexual Activity  . Alcohol use: Yes  . Drug use: No  . Sexual activity: Not on file  Other Topics Concern  . Not on file  Social History Narrative  . Not on file   Social Determinants of Health   Financial Resource Strain:   . Difficulty of Paying Living Expenses:    Food Insecurity:   . Worried About Charity fundraiser in the Last Year:   . Arboriculturist in the Last Year:   Transportation Needs:   . Film/video editor (Medical):   Marland Kitchen Lack of Transportation (Non-Medical):   Physical Activity:   . Days of Exercise per Week:   . Minutes of Exercise per Session:   Stress:   . Feeling of Stress :   Social Connections:   . Frequency of Communication with Friends and Family:   . Frequency of Social Gatherings with Friends and Family:   . Attends Religious Services:   . Active Member of Clubs or Organizations:   . Attends Archivist Meetings:   Marland Kitchen Marital Status:     Family History  Problem Relation Age of Onset  . Cancer Father        prostate    Health Maintenance  Topic Date Due  . INFLUENZA VACCINE  04/09/2020  . TETANUS/TDAP  09/09/2020  . COVID-19 Vaccine  Completed  . HIV Screening  Completed     ----------------------------------------------------------------------------------------------------------------------------------------------------------------------------------------------------------------- Physical Exam BP 112/77 (BP Location: Right Arm, Patient Position: Sitting, Cuff Size: Large)   Pulse 71   Wt 236 lb (107 kg)   BMI 28.73 kg/m   Physical Exam Constitutional:  Appearance: Normal appearance.  HENT:     Head: Normocephalic and atraumatic.     Mouth/Throat:     Mouth: Mucous membranes are moist.  Eyes:     General: No scleral icterus. Cardiovascular:     Rate and Rhythm: Normal rate and regular rhythm.  Pulmonary:     Effort: Pulmonary effort is normal.     Breath sounds: Normal breath sounds.  Musculoskeletal:     Cervical back: Neck supple.  Neurological:     General: No focal deficit present.     Mental Status: He is alert.  Psychiatric:        Mood and Affect: Mood normal.        Behavior: Behavior normal.      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 Urology referral entered.   Smoking He is interested in quitting.  Will send in rx for chantix.  Side effects reviewed with him.   Type 2 diabetes mellitus without complications ([place]) He would like to try 3 month trial of lifestyle change.  Discussed low carb diet. Recommend he have annual eye exam.  He will do this through [place].     Meds ordered this encounter  Medications  . varenicline (CHANTIX STARTING MONTH PAK) 0.5 MG X 11 & 1 MG X 42 tablet    Sig: Take 0.5 mg tablet by mouth once daily x3 days, then increase to 0.5 mg tablet BID for 4 days, then increase to one 1 mg tablet BID    Dispense:  53 tablet    Refill:  0    Return in about 3 months (around 04/04/2020) for DM.    This visit occurred during the SARS-CoV-2 public health emergency.  Safety protocols were in place, including screening questions prior to the visit, additional usage of staff PPE, and extensive cleaning of exam room while observing appropriate contact time as indicated for disinfecting solutions.

## 2020-01-12 ENCOUNTER — Encounter: Payer: Self-pay | Admitting: Family Medicine

## 2020-02-01 ENCOUNTER — Emergency Department (INDEPENDENT_AMBULATORY_CARE_PROVIDER_SITE_OTHER)
Admission: EM | Admit: 2020-02-01 | Discharge: 2020-02-01 | Disposition: A | Discharge status: Home / Self Care | Source: Home / Self Care

## 2020-02-01 ENCOUNTER — Other Ambulatory Visit: Payer: Self-pay

## 2020-02-01 DIAGNOSIS — L237 Allergic contact dermatitis due to plants, except food: Secondary | ICD-10-CM | POA: Diagnosis not present

## 2020-02-01 MED ORDER — CETIRIZINE HCL 10 MG PO TABS: 10.0000 mg | ORAL_TABLET | Freq: Every day | ORAL | 0 refills | Status: DC

## 2020-02-01 MED ORDER — PREDNISONE 50 MG PO TABS: 50.0000 mg | ORAL_TABLET | Freq: Every day | ORAL | 0 refills | Status: AC

## 2020-02-01 MED ORDER — TRIAMCINOLONE ACETONIDE 0.1 % EX CREA: 1.0000 "application " | TOPICAL_CREAM | Freq: Two times a day (BID) | CUTANEOUS | 0 refills | Status: DC

## 2020-02-01 MED ORDER — METHYLPREDNISOLONE ACETATE 80 MG/ML IJ SUSP
80.0000 mg | Freq: Once | INTRAMUSCULAR | Status: AC
  Administered 2020-02-01: 80 mg via INTRAMUSCULAR

## 2020-02-01 NOTE — Discharge Instructions (Signed)
  You were given a shot of depo-medrol (a steroid) today to help with itching and swelling from a likely allergic reaction.  You have been prescribed 5 days of prednisone, an oral steroid.  You may start this medication tomorrow with breakfast.    Try to avoid scratching at the rash and keep rash clean with warm water and mild soap, pat dry. This will help limit risk of secondary bacterial infection.  Call to schedule a follow up appointment with your primary care provider later this week or next week if not improving.

## 2020-02-01 NOTE — ED Provider Notes (Signed)
[name]    CSN: [id number] Arrival date & time: 02/01/20  1144      History   Chief Complaint Chief Complaint  Patient presents with  . Poison Ivy    HPI Robert [name] is a 39 y.o. male.   HPI Robert [name] is a 39 y.o. male presenting to UC with c/o 5 days of gradually worsening red itchy rash that developed last week after working in his yard. Pt believes he came in contact with poison ivy. Hx of similar rash in the past. He has used calamine lotion w/o relief. He has done well with prednisone in the past and would like to try today. Denies pain to the rash. No oral swelling or trouble breathing. No fever or chills.   Past Medical History:  Diagnosis Date  . Fatty liver 01/07/2017   Seen [place] 12/2016  . Prediabetes 07/07/2017    Patient Active Problem List   Diagnosis Date Noted  . 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 Smoking 01/27/2018  . Type 2 diabetes mellitus without complications ([place]) [id number]  . Fatty liver 01/07/2017  . Skin tags, multiple acquired 08/20/2016  . Multiple nevi 04/09/2016  . Back pain 02/06/2016  . Left varicocele 02/06/2016    History reviewed. No pertinent surgical history.     Home Medications    Prior to Admission medications   Medication Sig Start Date End Date Taking? Authorizing Provider  varenicline (CHANTIX STARTING MONTH PAK) 0.5 MG X 11 & 1 MG X 42 tablet Take 0.5 mg tablet by mouth once daily x3 days, then increase to 0.5 mg tablet BID for 4 days, then increase to one 1 mg tablet BID 01/04/20  Yes Luetta Nutting, DO  cetirizine (ZYRTEC) 10 MG tablet Take 1 tablet (10 mg total) by mouth daily. 02/01/20   Noe Gens, PA-C  diclofenac sodium (VOLTAREN) 1 % GEL Apply 2 g topically 4 (four) times daily. To affected joint. Patient not taking: Reported on 01/04/2020 05/08/17   Gregor Hams, MD  predniSONE (DELTASONE) 50 MG tablet Take 1 tablet (50 mg total) by mouth daily with breakfast for 5 days. 02/01/20 02/06/20   Noe Gens, PA-C  triamcinolone cream (KENALOG) 0.1 % Apply 1 application topically 2 (two) times daily. 02/01/20   Noe Gens, PA-C    Family History Family History  Problem Relation Age of Onset  . Cancer Father        prostate  . Healthy Mother     Social History Social History   Tobacco Use  . Smoking status: Current Every Day Smoker    Packs/day: 0.50    Years: 10.00    Pack years: 5.00  . Smokeless tobacco: Never Used  . Tobacco comment: using chantix  Substance Use Topics  . Alcohol use: Yes    Comment: rare  . Drug use: No     Allergies   Patient has no known allergies.   Review of Systems Review of Systems  HENT: Negative for facial swelling.   Gastrointestinal: Negative for diarrhea, nausea and vomiting.  Musculoskeletal: Negative for arthralgias, joint swelling and myalgias.  Skin: Positive for rash. Negative for wound.     Physical Exam Triage Vital Signs ED Triage Vitals  Enc Vitals Group     BP 02/01/20 1156 139/78     Pulse Rate 02/01/20 1156 67     Resp 02/01/20 1156 18     Temp 02/01/20 1156 97.8 F (36.6 C)  Temp Source 02/01/20 1156 Oral     SpO2 02/01/20 1156 99 %     Weight --      Height --      Head Circumference --      Peak Flow --      Pain Score 02/01/20 1155 0     Pain Loc --      Pain Edu? --      Excl. in [place]? --    No data found.  Updated Vital Signs BP 139/78 (BP Location: Left Arm)   Pulse 67   Temp 97.8 F (36.6 C) (Oral)   Resp 18   SpO2 99%   Visual Acuity Right Eye Distance:   Left Eye Distance:   Bilateral Distance:    Right Eye Near:   Left Eye Near:    Bilateral Near:     Physical Exam Vitals and nursing note reviewed.  Constitutional:      Appearance: He is well-developed.  HENT:     Head: Normocephalic and atraumatic.  Cardiovascular:     Rate and Rhythm: Normal rate.  Pulmonary:     Effort: Pulmonary effort is normal.  Musculoskeletal:        General: No swelling or  tenderness. Normal range of motion.     Cervical back: Normal range of motion.  Skin:    General: Skin is warm and dry.     Findings: Erythema and rash present.          Comments: Diffuse erythematous vesicular rash on bilateral arms, hands and lower legs. Most prominent rash is 10in linear distribution on Right forearm. Scant yellow crusting discharge. Non-tender.   Neurological:     Mental Status: He is alert and oriented to person, place, and time.  Psychiatric:        Behavior: Behavior normal.      UC Treatments / Results  Labs (all labs ordered are listed, but only abnormal results are displayed) Labs Reviewed - No data to display  EKG   Radiology No results found.  Procedures Procedures (including critical care time)  Medications Ordered in UC Medications  methylPREDNISolone acetate (DEPO-MEDROL) injection 80 mg (80 mg Intramuscular Given 02/01/20 1208)    Initial Impression / Assessment and Plan / UC Course  I have reviewed the triage vital signs and the nursing notes.  Pertinent labs & imaging results that were available during my care of the patient were reviewed by me and considered in my medical decision making (see chart for details).     Hx and exam c/w contact dermatitis w/o evidence of secondary bacterial infection at this time Will tx with steroids AVS provided  Final Clinical Impressions(s) / UC Diagnoses   Final diagnoses:  Allergic contact dermatitis due to plants, except food     Discharge Instructions      You were given a shot of depo-medrol (a steroid) today to help with itching and swelling from a likely allergic reaction.  You have been prescribed 5 days of prednisone, an oral steroid.  You may start this medication tomorrow with breakfast.    Try to avoid scratching at the rash and keep rash clean with warm water and mild soap, pat dry. This will help limit risk of secondary bacterial infection.  Call to schedule a follow up  appointment with your primary care provider later this week or next week if not improving.     ED Prescriptions    Medication Sig Dispense Auth. Provider  predniSONE (DELTASONE) 50 MG tablet Take 1 tablet (50 mg total) by mouth daily with breakfast for 5 days. 5 tablet Leeroy Cha O, PA-C   triamcinolone cream (KENALOG) 0.1 % Apply 1 application topically 2 (two) times daily. 30 g Noe Gens, PA-C   cetirizine (ZYRTEC) 10 MG tablet Take 1 tablet (10 mg total) by mouth daily. 30 tablet Noe Gens, [place]     PDMP not reviewed this encounter.   Noe Gens, [place] 02/01/20 [phone]

## 2020-02-01 NOTE — ED Triage Notes (Signed)
Patient presents to Urgent Care with complaints of possible poison ivy rash since about a week ago. Patient reports he has put calamine lotion on but it has not helped. Pt has had similar outbreaks in the past, rash has not spread to his face but it is all over his arms and legs bilaterally.

## 2020-02-27 ENCOUNTER — Encounter: Payer: Self-pay | Admitting: Emergency Medicine

## 2020-02-27 ENCOUNTER — Emergency Department (INDEPENDENT_AMBULATORY_CARE_PROVIDER_SITE_OTHER)

## 2020-02-27 ENCOUNTER — Other Ambulatory Visit: Payer: Self-pay

## 2020-02-27 ENCOUNTER — Emergency Department (INDEPENDENT_AMBULATORY_CARE_PROVIDER_SITE_OTHER)
Admission: EM | Admit: 2020-02-27 | Discharge: 2020-02-27 | Disposition: A | Discharge status: Home / Self Care | Source: Home / Self Care | Attending: Family Medicine | Admitting: Family Medicine

## 2020-02-27 DIAGNOSIS — R05 Cough: Secondary | ICD-10-CM | POA: Diagnosis not present

## 2020-02-27 DIAGNOSIS — J069 Acute upper respiratory infection, unspecified: Secondary | ICD-10-CM | POA: Diagnosis not present

## 2020-02-27 MED ORDER — PREDNISONE 20 MG PO TABS: ORAL_TABLET | ORAL | 0 refills | Status: DC

## 2020-02-27 NOTE — ED Triage Notes (Signed)
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 Has received COVID vaccine. No OTC today.

## 2020-02-27 NOTE — ED Provider Notes (Signed)
[name]    CSN: [id number] Arrival date & time: 02/27/20  0931      History   Chief Complaint Chief Complaint  Patient presents with  . Cough    HPI Robert [name] is a 39 y.o. male.   Patient developed a non-productive cough about a week ago, followed by a sore throat and sinus congestion.  He denies fevers, chills, and sweats and feels well otherwise.  He continues to smoke.   The history is provided by the patient.    Past Medical History:  Diagnosis Date  . Fatty liver 01/07/2017   Seen [place] 12/2016  . Prediabetes 07/07/2017    Patient Active Problem List   Diagnosis Date Noted  . 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 Smoking 01/27/2018  . Type 2 diabetes mellitus without complications ([place]) 03/49/1791  . Fatty liver 01/07/2017  . Skin tags, multiple acquired 08/20/2016  . Multiple nevi 04/09/2016  . Back pain 02/06/2016  . Left varicocele 02/06/2016    History reviewed. No pertinent surgical history.     Home Medications    Prior to Admission medications   Medication Sig Start Date End Date Taking? Authorizing Provider  cetirizine (ZYRTEC) 10 MG tablet Take 1 tablet (10 mg total) by mouth daily. 02/01/20   Noe Gens, PA-C  diclofenac sodium (VOLTAREN) 1 % GEL Apply 2 g topically 4 (four) times daily. To affected joint. Patient not taking: Reported on 01/04/2020 05/08/17   Gregor Hams, MD  predniSONE (DELTASONE) 20 MG tablet Take one tab by mouth twice daily for 3 days, then one daily for 2 days. Take with food. 02/27/20   Kandra Nicolas, MD  triamcinolone cream (KENALOG) 0.1 % Apply 1 application topically 2 (two) times daily. 02/01/20   Noe Gens, PA-C  varenicline (CHANTIX STARTING MONTH PAK) 0.5 MG X 11 & 1 MG X 42 tablet Take 0.5 mg tablet by mouth once daily x3 days, then increase to 0.5 mg tablet BID for 4 days, then increase to one 1 mg tablet BID 01/04/20   Luetta Nutting, DO    Family History Family History  Problem Relation  Age of Onset  . Cancer Father        prostate  . Healthy Mother     Social History Social History   Tobacco Use  . Smoking status: Current Some Day Smoker    Packs/day: 0.50    Years: 10.00    Pack years: 5.00  . Smokeless tobacco: Never Used  . Tobacco comment: using chantix  Substance Use Topics  . Alcohol use: Yes    Comment: rare  . Drug use: No     Allergies   Patient has no known allergies.   Review of Systems Review of Systems + sore throat + cough No pleuritic pain No wheezing + nasal congestion + post-nasal drainage No sinus pain/pressure No itchy/red eyes No earache No hemoptysis No SOB No fever/chills No nausea No vomiting No abdominal pain No diarrhea No urinary symptoms No skin rash + fatigue No myalgias No headache Used OTC meds (pseudoephedrine) without relief   Physical Exam Triage Vital Signs ED Triage Vitals  Enc Vitals Group     BP 02/27/20 0955 131/79     Pulse Rate 02/27/20 0955 (!) 59     Resp 02/27/20 0955 16     Temp 02/27/20 0955 98.6 F (37 C)     Temp Source 02/27/20 0955 Oral     SpO2 02/27/20  0955 97 %     Weight 02/27/20 0956 230 lb (104.3 kg)     Height 02/27/20 0956 6\' 5"  (1.956 m)     Head Circumference --      Peak Flow --      Pain Score 02/27/20 0955 0     Pain Loc --      Pain Edu? --      Excl. in [place]? --    No data found.  Updated Vital Signs BP 131/79 (BP Location: Right Arm)   Pulse (!) 59   Temp 98.6 F (37 C) (Oral)   Resp 16   Ht 6\' 5"  (1.956 m)   Wt 104.3 kg   SpO2 97%   BMI 27.27 kg/m   Visual Acuity Right Eye Distance:   Left Eye Distance:   Bilateral Distance:    Right Eye Near:   Left Eye Near:    Bilateral Near:     Physical Exam Nursing notes and Vital Signs reviewed. Appearance:  Patient appears stated age, and in no acute distress Eyes:  Pupils are equal, round, and reactive to light and accomodation.  Extraocular movement is intact.  Conjunctivae are not inflamed   Ears:  Canals normal.  Tympanic membranes normal.  Nose:  Mildly congested turbinates.  No sinus tenderness.  Pharynx:  Normal Neck:  Supple.  No adenopathy. Lungs:  Scattered rhonchi with cough.  Breath sounds are equal.  Moving air well. Heart:  Regular rate and rhythm without murmurs, rubs, or gallops.  Abdomen:  Nontender without masses or hepatosplenomegaly.  Bowel sounds are present.  No CVA or flank tenderness.  Extremities:  No edema.  Skin:  No rash present.   UC Treatments / Results  Labs (all labs ordered are listed, but only abnormal results are displayed) Labs Reviewed - No data to display  EKG   Radiology DG Chest 2 View  Result Date: 02/27/2020 CLINICAL DATA:  Cough for 1 week EXAM: CHEST - 2 VIEW COMPARISON:  01/27/2014 FINDINGS: The heart size and mediastinal contours are within normal limits. No focal airspace consolidation, pleural effusion, or pneumothorax. The visualized skeletal structures are unremarkable. IMPRESSION: No active cardiopulmonary disease. Electronically Signed   By: Davina Poke D.O.   On: 02/27/2020 11:30    Procedures Procedures (including critical care time)  Medications Ordered in UC Medications - No data to display  Initial Impression / Assessment and Plan / UC Course  I have reviewed the triage vital signs and the nursing notes.  Pertinent labs & imaging results that were available during my care of the patient were reviewed by me and considered in my medical decision making (see chart for details).    Benign exam.  There is no evidence of bacterial infection today.   Begin prednisone burst/taper. Followup with Family Doctor if not improved in about 8 days.   Final Clinical Impressions(s) / UC Diagnoses   Final diagnoses:  Viral URI with cough     Discharge Instructions     Take plain guaifenesin (1200mg  extended release tabs such as Mucinex) twice daily, with plenty of water, for cough and congestion.   Get adequate  rest.   May use Afrin nasal spray (or generic oxymetazoline) each morning for about 5 days and then discontinue.  Also recommend using saline nasal spray several times daily and saline nasal irrigation (AYR is a common brand).  Use Flonase nasal spray each morning after using Afrin nasal spray and saline nasal irrigation. May take  Delsym Cough Suppressant at bedtime for nighttime cough.  Stop all antihistamines for now, and other non-prescription cough/cold preparations.      ED Prescriptions    Medication Sig Dispense Auth. Provider   predniSONE (DELTASONE) 20 MG tablet Take one tab by mouth twice daily for 3 days, then one daily for 2 days. Take with food. 8 tablet Kandra Nicolas, MD        Kandra Nicolas, MD 03/04/20 [phone]

## 2020-02-27 NOTE — Discharge Instructions (Signed)
Take plain guaifenesin (1200mg  extended release tabs such as Mucinex) twice daily, with plenty of water, for cough and congestion.   Get adequate rest.   May use Afrin nasal spray (or generic oxymetazoline) each morning for about 5 days and then discontinue.  Also recommend using saline nasal spray several times daily and saline nasal irrigation (AYR is a common brand).  Use Flonase nasal spray each morning after using Afrin nasal spray and saline nasal irrigation. May take Delsym Cough Suppressant at bedtime for nighttime cough.  Stop all antihistamines for now, and other non-prescription cough/cold preparations.

## 2020-04-04 ENCOUNTER — Ambulatory Visit: Admitting: Family Medicine

## 2020-06-06 ENCOUNTER — Encounter: Payer: Self-pay | Admitting: Emergency Medicine

## 2020-06-06 ENCOUNTER — Other Ambulatory Visit: Payer: Self-pay

## 2020-06-06 ENCOUNTER — Emergency Department (INDEPENDENT_AMBULATORY_CARE_PROVIDER_SITE_OTHER)
Admission: EM | Admit: 2020-06-06 | Discharge: 2020-06-06 | Disposition: A | Discharge status: Home / Self Care | Source: Home / Self Care | Attending: Family Medicine | Admitting: Family Medicine

## 2020-06-06 DIAGNOSIS — M7050 Other bursitis of knee, unspecified knee: Secondary | ICD-10-CM | POA: Diagnosis not present

## 2020-06-06 DIAGNOSIS — M222X2 Patellofemoral disorders, left knee: Secondary | ICD-10-CM

## 2020-06-06 HISTORY — DX: Hyperlipidemia, unspecified: E78.5

## 2020-06-06 NOTE — ED Provider Notes (Signed)
[name]    CSN: [id number] Arrival date & time: 06/06/20  5176      History   Chief Complaint Chief Complaint  Patient presents with  . Knee Pain    HPI Robert [name] is a 39 y.o. male.   Patient complains of pain in both knees that started about 4 to 5 days ago.  He recalls no injury but states that he has been walking farther (about 8 miles/day) and faster at work every day.  His pain is especially worse when he walks up stairs.  The history is provided by the patient.  Knee Pain Location:  Knee Time since incident:  5 days Injury: no   Knee location:  L knee and R knee Pain details:    Quality:  Aching   Radiates to:  Does not radiate   Severity:  Moderate   Onset quality:  Gradual   Duration:  5 days   Timing:  Constant   Progression:  Worsening Chronicity:  New Relieved by:  Rest Worsened by:  Activity (walking stairs) Ineffective treatments:  NSAIDs Associated symptoms: stiffness   Associated symptoms: no back pain, no decreased ROM, no muscle weakness, no numbness, no swelling and no tingling     Past Medical History:  Diagnosis Date  . Fatty liver 01/07/2017   Seen [place] 12/2016  . Hyperlipidemia   . Prediabetes 07/07/2017    Patient Active Problem List   Diagnosis Date Noted  . 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 Smoking 01/27/2018  . Type 2 diabetes mellitus without complications ([place]) 16/03/3709  . Fatty liver 01/07/2017  . Skin tags, multiple acquired 08/20/2016  . Multiple nevi 04/09/2016  . Back pain 02/06/2016  . Left varicocele 02/06/2016    History reviewed. No pertinent surgical history.     Home Medications    Prior to Admission medications   Medication Sig Start Date End Date Taking? Authorizing Provider  atorvastatin (LIPITOR) 20 MG tablet Take 20 mg by mouth daily.   Yes [provider]    Family History Family History  Problem Relation Age of Onset  . Cancer Father        prostate  . Healthy  Mother     Social History Social History   Tobacco Use  . Smoking status: Current Some Day Smoker    Packs/day: 0.50    Years: 10.00    Pack years: 5.00  . Smokeless tobacco: Never Used  . Tobacco comment: using chantix  Vaping Use  . Vaping Use: Never used  Substance Use Topics  . Alcohol use: Yes    Comment: rare  . Drug use: No     Allergies   Patient has no known allergies.   Review of Systems Review of Systems  Musculoskeletal: Positive for stiffness. Negative for back pain.  All other systems reviewed and are negative.    Physical Exam Triage Vital Signs ED Triage Vitals  Enc Vitals Group     BP 06/06/20 0833 125/83     Pulse Rate 06/06/20 0833 73     Resp 06/06/20 0833 16     Temp 06/06/20 0833 98.2 F (36.8 C)     Temp Source 06/06/20 0833 Oral     SpO2 06/06/20 0833 97 %     Weight 06/06/20 0834 230 lb (104.3 kg)     Height 06/06/20 0834 6\' 5"  (1.956 m)     Head Circumference --      Peak Flow --  Pain Score 06/06/20 0833 4     Pain Loc --      Pain Edu? --      Excl. in [place]? --    No data found.  Updated Vital Signs BP 125/83 (BP Location: Right Arm)   Pulse 73   Temp 98.2 F (36.8 C) (Oral)   Resp 16   Ht 6\' 5"  (1.956 m)   Wt 104.3 kg   SpO2 97%   BMI 27.27 kg/m   Visual Acuity Right Eye Distance:   Left Eye Distance:   Bilateral Distance:    Right Eye Near:   Left Eye Near:    Bilateral Near:     Physical Exam Vitals and nursing note reviewed.  Constitutional:      General: He is not in acute distress. HENT:     Head: Normocephalic.     Mouth/Throat:     Pharynx: Oropharynx is clear.  Eyes:     Pupils: Pupils are equal, round, and reactive to light.  Pulmonary:     Effort: Pulmonary effort is normal.  Musculoskeletal:        General: Normal range of motion.     Cervical back: Normal range of motion.       Legs:     Comments: Bilateral knees:  No effusion, erythema, or warmth.  Knees stable, negative drawer  test.  McMurray test negative.    Tenderness over pes anserine bursae bilaterally.  Pain elicited left knee with pressure applied over patella.  Skin:    General: Skin is warm and dry.  Neurological:     Mental Status: He is alert.      UC Treatments / Results  Labs (all labs ordered are listed, but only abnormal results are displayed) Labs Reviewed - No data to display  EKG   Radiology No results found.  Procedures Procedures (including critical care time)  Medications Ordered in UC Medications - No data to display  Initial Impression / Assessment and Plan / UC Course  I have reviewed the triage vital signs and the nursing notes.  Pertinent labs & imaging results that were available during my care of the patient were reviewed by me and considered in my medical decision making (see chart for details).    Bilateral pes anserine bursitis.  Left knee also has mild patellofemoral inflammation. Followup with Dr. Aundria Mems ([facility]) if not improving about two weeks.    Final Clinical Impressions(s) / UC Diagnoses   Final diagnoses:  Pes anserine bursitis  Patellofemoral syndrome of left knee     Discharge Instructions     Apply ice pack for 20 to 30 minutes, 3 to 4 times daily  Continue until pain and swelling decrease.  Take Ibuprofen 200mg , 4 tabs every 8 hours with food.  Begin range of motion and stretching exercises as tolerated.    ED Prescriptions    None        Kandra Nicolas, MD 06/08/20 [phone]

## 2020-06-06 NOTE — ED Triage Notes (Signed)
Bi-lateral knee pain from walking at work. Started 5 days ago.

## 2020-06-06 NOTE — Discharge Instructions (Addendum)
Apply ice pack for 20 to 30 minutes, 3 to 4 times daily  Continue until pain and swelling decrease.  Take Ibuprofen 200mg , 4 tabs every 8 hours with food.  Begin range of motion and stretching exercises as tolerated.

## 2021-01-22 ENCOUNTER — Other Ambulatory Visit: Payer: Self-pay

## 2021-01-22 ENCOUNTER — Encounter: Payer: Self-pay | Admitting: Emergency Medicine

## 2021-01-22 ENCOUNTER — Emergency Department (INDEPENDENT_AMBULATORY_CARE_PROVIDER_SITE_OTHER)
Admission: EM | Admit: 2021-01-22 | Discharge: 2021-01-22 | Disposition: A | Discharge status: Home / Self Care | Source: Home / Self Care | Attending: Family Medicine | Admitting: Family Medicine

## 2021-01-22 ENCOUNTER — Emergency Department: Admit: 2021-01-22 | Discharge status: Absent without leave | Payer: Self-pay

## 2021-01-22 DIAGNOSIS — B3749 Other urogenital candidiasis: Secondary | ICD-10-CM

## 2021-01-22 MED ORDER — FLUCONAZOLE 150 MG PO TABS: 150.0000 mg | ORAL_TABLET | Freq: Once | ORAL | 0 refills | Status: AC

## 2021-01-22 MED ORDER — CLOTRIMAZOLE 1 % EX CREA: 1.0000 "application " | TOPICAL_CREAM | Freq: Two times a day (BID) | CUTANEOUS | 0 refills | Status: DC

## 2021-01-22 NOTE — ED Provider Notes (Signed)
Robert [name] CARE    CSN: [id number] Arrival date & time: 01/22/21  1612      History   Chief Complaint Chief Complaint  Patient presents with  . skin irritation    HPI Robert [name] is a 40 y.o. male.   Patient complains of redness and irritation to the head of his penis for about two months.  He denies rash, dysuria, urethral discharge, and pain in testicles.  He reports that his wife had taken antibiotics for an infection, resulting in candida vaginitis. Patient has tried hydrocortisone cream and topical acyclovir without resolution.   The history is provided by the patient.    Past Medical History:  Diagnosis Date  . Fatty liver 01/07/2017   Seen [place] 12/2016  . Hyperlipidemia   . Prediabetes 07/07/2017    Patient Active Problem List   Diagnosis Date Noted  . 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 Smoking 01/27/2018  . Type 2 diabetes mellitus without complications ([place]) 87/56/4332  . Fatty liver 01/07/2017  . Skin tags, multiple acquired 08/20/2016  . Multiple nevi 04/09/2016  . Back pain 02/06/2016  . Left varicocele 02/06/2016    History reviewed. No pertinent surgical history.     Home Medications    Prior to Admission medications   Medication Sig Start Date End Date Taking? Authorizing Provider  clotrimazole (LOTRIMIN) 1 % cream Apply 1 application topically 2 (two) times daily. 01/22/21  Yes Kandra Nicolas, MD  fluconazole (DIFLUCAN) 150 MG tablet Take 1 tablet (150 mg total) by mouth once for 1 dose. May repeat in 72 hours. 01/22/21 01/22/21 Yes Kandra Nicolas, MD  atorvastatin (LIPITOR) 20 MG tablet Take 20 mg by mouth daily. Patient not taking: Reported on 01/22/2021    [provider]    Family History Family History  Problem Relation Age of Onset  . Cancer Father        prostate  . Healthy Mother     Social History Social History   Tobacco Use  . Smoking status: Current Some Day Smoker    Packs/day: 0.50    Years: 10.00     Pack years: 5.00  . Smokeless tobacco: Never Used  . Tobacco comment: using chantix  Vaping Use  . Vaping Use: Never used  Substance Use Topics  . Alcohol use: Yes    Comment: rare  . Drug use: No     Allergies   Patient has no known allergies.   Review of Systems Review of Systems  Constitutional: Negative for chills, diaphoresis, fatigue and fever.  Genitourinary: Negative for difficulty urinating, dysuria, genital sores, penile discharge, penile pain, penile swelling and scrotal swelling.  Skin: Positive for color change. Negative for rash.  All other systems reviewed and are negative.    Physical Exam Triage Vital Signs ED Triage Vitals  Enc Vitals Group     BP 01/22/21 1713 124/88     Pulse Rate 01/22/21 1713 78     Resp 01/22/21 1713 15     Temp 01/22/21 1713 98.5 F (36.9 C)     Temp Source 01/22/21 1713 Oral     SpO2 01/22/21 1713 99 %     Weight 01/22/21 1715 240 lb (108.9 kg)     Height 01/22/21 1715 6\' 5"  (1.956 m)     Head Circumference --      Peak Flow --      Pain Score 01/22/21 1714 2     Pain Loc --  Pain Edu? --      Excl. in [place]? --    No data found.  Updated Vital Signs BP 124/88 (BP Location: Right Arm)   Pulse 78   Temp 98.5 F (36.9 C) (Oral)   Resp 15   Ht 6\' 5"  (1.956 m)   Wt 108.9 kg   SpO2 99%   BMI 28.46 kg/m   Visual Acuity Right Eye Distance:   Left Eye Distance:   Bilateral Distance:    Right Eye Near:   Left Eye Near:    Bilateral Near:     Physical Exam Vitals and nursing note reviewed.  Constitutional:      General: He is not in acute distress. HENT:     Head: Normocephalic.     Mouth/Throat:     Pharynx: Oropharynx is clear.  Cardiovascular:     Rate and Rhythm: Normal rate.  Pulmonary:     Effort: Pulmonary effort is normal.  Genitourinary:    Penis: Erythema present. No tenderness, discharge, swelling or lesions.      Testes: Normal.       Comments: Patient's penis has scant whitish  exudate beneath foreskin suggestive of candida. Skin:    General: Skin is warm and dry.     Findings: No rash.  Neurological:     Mental Status: He is alert.      UC Treatments / Results  Labs (all labs ordered are listed, but only abnormal results are displayed) Labs Reviewed - No data to display  EKG   Radiology No results found.  Procedures Procedures (including critical care time)  Medications Ordered in UC Medications - No data to display  Initial Impression / Assessment and Plan / UC Course  I have reviewed the triage vital signs and the nursing notes.  Pertinent labs & imaging results that were available during my care of the patient were reviewed by me and considered in my medical decision making (see chart for details).    Suspect candida.  Fungus culture pending.  Begin empiric Diflucan. Followup with dermatologist if not improved 2 weeks.   Final Clinical Impressions(s) / UC Diagnoses   Final diagnoses:  Yeast dermatitis of penis     Discharge Instructions     May begin using topical antifungal cream if not improving with oral medication.    ED Prescriptions    Medication Sig Dispense Auth. Provider   fluconazole (DIFLUCAN) 150 MG tablet Take 1 tablet (150 mg total) by mouth once for 1 dose. May repeat in 72 hours. 2 tablet Kandra Nicolas, MD   clotrimazole (LOTRIMIN) 1 % cream Apply 1 application topically 2 (two) times daily. 28 g Kandra Nicolas, MD        Kandra Nicolas, MD 01/25/21 1248

## 2021-01-22 NOTE — ED Triage Notes (Signed)
Pt has had redness & irritation to the head of his penis x 2 months  Per pt his wife was on antibiotics for an infection (lung), developed a yeast infection  -pt believes the infection transferred to him He has a PCP  & is seen at the [place] Pt has treated it w/ hydrocortisone, topical ACV

## 2021-01-22 NOTE — Discharge Instructions (Signed)
May begin using topical antifungal cream if not improving with oral medication.

## 2021-02-19 LAB — CULTURE, FUNGUS WITHOUT SMEAR
MICRO NUMBER:: 11897207
SPECIMEN QUALITY:: ADEQUATE

## 2021-06-28 ENCOUNTER — Telehealth: Payer: Self-pay | Admitting: Emergency Medicine

## 2021-06-28 ENCOUNTER — Telehealth: Payer: Self-pay | Admitting: Family Medicine

## 2021-06-28 ENCOUNTER — Emergency Department (INDEPENDENT_AMBULATORY_CARE_PROVIDER_SITE_OTHER)
Admission: EM | Admit: 2021-06-28 | Discharge: 2021-06-28 | Disposition: A | Discharge status: Home / Self Care | Source: Home / Self Care | Attending: Family Medicine | Admitting: Family Medicine

## 2021-06-28 DIAGNOSIS — S339XXA Sprain of unspecified parts of lumbar spine and pelvis, initial encounter: Secondary | ICD-10-CM | POA: Diagnosis not present

## 2021-06-28 MED ORDER — METHYLPREDNISOLONE 4 MG PO TBPK: ORAL_TABLET | ORAL | 0 refills | Status: DC

## 2021-06-28 MED ORDER — CYCLOBENZAPRINE HCL 10 MG PO TABS: 10.0000 mg | ORAL_TABLET | Freq: Two times a day (BID) | ORAL | 0 refills | Status: DC | PRN

## 2021-06-28 NOTE — ED Provider Notes (Signed)
Robert [name] CARE    CSN: [id number] Arrival date & time: 06/28/21  0848      History   Chief Complaint Chief Complaint  Patient presents with   Back Pain    HPI Robert [name] is a 40 y.o. male.   HPI  Patient is here for low back pain.  Slightly left worse than right.  Also tightness in both hamstrings.  Happened after he increased his weightlifting at the gym.  He states he does not do a lot of heavy lifting at his work.  There is no fall or trauma.  He has no known back condition, "possibly scoliosis" from prior x-rays.  No numbness or weakness.  Review of his x-ray report indicates that he has a mild scoliosis left with degenerative changes and a 4 mm L5 on S1 spondylolisthesis.  Patient is unaware of this finding.  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 No pertinent surgical history.     Home Medications    Prior to Admission medications   Medication Sig Start Date End Date Taking? Authorizing Provider  cyclobenzaprine (FLEXERIL) 10 MG tablet Take 1 tablet (10 mg total) by mouth 2 (two) times daily as needed for muscle spasms. 06/28/21  Yes Raylene Everts, MD  methylPREDNISolone (MEDROL DOSEPAK) 4 MG TBPK tablet tad 06/28/21  Yes Raylene Everts, MD  atorvastatin (LIPITOR) 20 MG tablet Take 20 mg by mouth daily. Patient not taking: Reported on 01/22/2021    [provider]  clotrimazole (LOTRIMIN) 1 % cream Apply 1 application topically 2 (two) times daily. 01/22/21   Kandra Nicolas, MD    Family History Family  History  Problem Relation Age of Onset   Cancer Father        prostate   Healthy Mother     Social History Social History   Tobacco Use   Smoking status: Some Days    Packs/day: 0.50    Years: 10.00    Pack years: 5.00    Types: Cigarettes   Smokeless tobacco: Never   Tobacco comments:    using chantix  Vaping Use   Vaping Use: Never used  Substance Use Topics   Alcohol use: Yes    Comment: rare   Drug use: No     Allergies   Patient has no known allergies.   Review of Systems Review of Systems  See HPI Physical Exam Triage Vital Signs ED Triage Vitals  Enc Vitals Group     BP 06/28/21 0903 116/78     Pulse Rate 06/28/21 0903 74     Resp 06/28/21 0903 16     Temp 06/28/21 0903 98.3 F (36.8 C)     Temp Source 06/28/21 0903 Oral     SpO2 06/28/21 0903 97 %     Weight --      Height --      Head Circumference --      Peak Flow --      Pain Score 06/28/21 0902 6  Pain Loc --      Pain Edu? --      Excl. in [place]? --    No data found.  Updated Vital Signs BP 116/78 (BP Location: Left Arm)   Pulse 74   Temp 98.3 F (36.8 C) (Oral)   Resp 16   SpO2 97%       Physical Exam Vitals reviewed.  Constitutional:      General: He is not in acute distress.    Appearance: He is well-developed.     Comments: Guarded movements  HENT:     Head: Normocephalic and atraumatic.     Mouth/Throat:     Comments: Mask in place Eyes:     Conjunctiva/sclera: Conjunctivae normal.     Pupils: Pupils are equal, round, and reactive to light.  Cardiovascular:     Rate and Rhythm: Normal rate.  Pulmonary:     Effort: Pulmonary effort is normal. No respiratory distress.  Abdominal:     General: There is no distension.     Palpations: Abdomen is soft.  Musculoskeletal:        General: Normal range of motion.     Cervical back: Normal range of motion.     Comments: Patient has limited forward flexion.  He has mild tenderness in the lumbar column of muscles  bilaterally with increased muscle tightness.  There is tenderness palpation in the left greater than right SI region of the posterior pelvis.  Strength sensation range of motion reflexes are normal in both lower extremities with negative straight leg raise bilaterally  Skin:    General: Skin is warm and dry.  Neurological:     Mental Status: He is alert.     Motor: No weakness.     Coordination: Coordination normal.     Gait: Gait normal.     Deep Tendon Reflexes: Reflexes normal.     UC Treatments / Results  Labs (all labs ordered are listed, but only abnormal results are displayed) Labs Reviewed - No data to display  EKG   Radiology No results found.  Procedures Procedures (including critical care time)  Medications Ordered in UC Medications - No data to display  Initial Impression / Assessment and Plan / UC Course  I have reviewed the triage vital signs and the nursing notes.  Pertinent labs & imaging results that were available during my care of the patient were reviewed by me and considered in my medical decision making (see chart for details).     Discussed conservative management of back pain.  Imaging if he fails to improve.  Follow-up with primary care or sports medicine as desired Final Clinical Impressions(s) / UC Diagnoses   Final diagnoses:  Lumbosacral ligament sprain, initial encounter     Discharge Instructions      Take Medrol as directed.  Take all of day 1 today. Hold meloxicam (Mobic) while on Medrol Take Flexeril as needed as muscle relaxer.  This may cause drowsiness during the day, it is helpful at night May use ice or heat to painful areas If not improving by next week consider orthopedic follow-up or sports medicine    ED Prescriptions     Medication Sig Dispense Auth. Provider   methylPREDNISolone (MEDROL DOSEPAK) 4 MG TBPK tablet tad 21 tablet Raylene Everts, MD   cyclobenzaprine (FLEXERIL) 10 MG tablet Take 1 tablet (10 mg  total) by mouth 2 (two) times daily as needed for muscle spasms. 20 tablet Raylene Everts, MD  PDMP not reviewed this encounter.   Raylene Everts, MD 06/28/21 [phone]

## 2021-06-28 NOTE — Telephone Encounter (Signed)
Patient accidentally gave the wrong pharmacy when he was here.  His medicines got [facility]  He is calling back again at 5:00 he said he still only has one of his prescriptions.  We will try sending them again.

## 2021-06-28 NOTE — Telephone Encounter (Signed)
Call from [facility] regarding prescription- [facility]s [facility] [address]. [place] has tricare & meds need to [facility] to a [facility]. Pharmacy changed

## 2021-06-28 NOTE — ED Triage Notes (Signed)
Pt present lower back pain with some muscle spasm. Pt states that the symptoms started two weeks ago.

## 2021-06-28 NOTE — Discharge Instructions (Signed)
Take Medrol as directed.  Take all of day 1 today. Hold meloxicam (Mobic) while on Medrol Take Flexeril as needed as muscle relaxer.  This may cause drowsiness during the day, it is helpful at night May use ice or heat to painful areas If not improving by next week consider orthopedic follow-up or sports medicine

## 2021-07-05 ENCOUNTER — Other Ambulatory Visit: Payer: Self-pay

## 2021-07-05 ENCOUNTER — Ambulatory Visit (INDEPENDENT_AMBULATORY_CARE_PROVIDER_SITE_OTHER): Admitting: Sports Medicine

## 2021-07-05 ENCOUNTER — Ambulatory Visit (INDEPENDENT_AMBULATORY_CARE_PROVIDER_SITE_OTHER)

## 2021-07-05 DIAGNOSIS — M4317 Spondylolisthesis, lumbosacral region: Secondary | ICD-10-CM | POA: Insufficient documentation

## 2021-07-05 DIAGNOSIS — M545 Low back pain, unspecified: Secondary | ICD-10-CM

## 2021-07-05 DIAGNOSIS — G8929 Other chronic pain: Secondary | ICD-10-CM

## 2021-07-05 MED ORDER — NAPROXEN 500 MG PO TABS: 500.0000 mg | ORAL_TABLET | Freq: Two times a day (BID) | ORAL | 3 refills | Status: DC

## 2021-07-05 NOTE — Progress Notes (Signed)
    Procedures performed today:    None.  Independent interpretation of notes and tests performed by another provider:   None.  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 We discussed the anatomy, pathophysiology and treatment protocol, adding updated x-rays with flexion/extension views, naproxen, formal physical therapy for 4 to 6 weeks, if insufficient improvement we will proceed with MRI and epidural planning.  Chronic process with exacerbation and pharmacologic intervention  ___________________________________________ Gwen Her. Dianah Field, M.D., ABFM., CAQSM. Primary Care and [place] Instructor of [place] [employer] [facility]

## 2021-07-05 NOTE — Assessment & Plan Note (Signed)
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 We discussed the anatomy, pathophysiology and treatment protocol, adding updated x-rays with flexion/extension views, naproxen, formal physical therapy for 4 to 6 weeks, if insufficient improvement we will proceed with MRI and epidural planning.

## 2021-07-12 ENCOUNTER — Ambulatory Visit (INDEPENDENT_AMBULATORY_CARE_PROVIDER_SITE_OTHER): Admitting: Physical Therapy

## 2021-07-12 ENCOUNTER — Other Ambulatory Visit: Payer: Self-pay

## 2021-07-12 DIAGNOSIS — G8929 Other chronic pain: Secondary | ICD-10-CM

## 2021-07-12 DIAGNOSIS — R2689 Other abnormalities of gait and mobility: Secondary | ICD-10-CM | POA: Diagnosis not present

## 2021-07-12 DIAGNOSIS — R293 Abnormal posture: Secondary | ICD-10-CM

## 2021-07-12 DIAGNOSIS — M6281 Muscle weakness (generalized): Secondary | ICD-10-CM | POA: Diagnosis not present

## 2021-07-12 DIAGNOSIS — M545 Low back pain, unspecified: Secondary | ICD-10-CM | POA: Diagnosis not present

## 2021-07-12 NOTE — Therapy (Signed)
[place], [place], [address] Phone: [phone]   Fax:  [phone]  Physical Therapy Evaluation  Patient Details  Name: Robert [name] MRN: [id number] Date of Birth: 09/16/1980 Referring Provider (PT): Silverio Decamp, MD   Encounter Date: 07/12/2021   PT End of Session - 07/12/21 1401     Visit Number 1    Number of Visits 6    Date for PT Re-Evaluation 08/23/21    Authorization Type [employer]    PT Start Time 1401    PT Stop Time 1638    PT Time Calculation (min) 44 min    Activity Tolerance Patient tolerated treatment well    Behavior During Therapy [facility] for tasks assessed/performed             Past Medical History:  Diagnosis Date   Fatty liver 01/07/2017   Seen [place] 12/2016   Hyperlipidemia    Prediabetes 07/07/2017    No past surgical history on file.  There were no vitals filed for this visit.    Subjective Assessment - 07/12/21 1403     Subjective Pt reports increased low back pain (ongoing for multiple years). Pt notes that pain can be left or right. X-rays showed L5-S1 spondylolisthesis. Pt states it's not every time but it can be difficult to get up/out of chair. Two instances where it radiated to the front of his thighs. Pt states he was doing heavy lifting with squatting machine. "restricted movement" will feel it ~3x in the day.    Limitations Sitting;Standing    How long can you sit comfortably? can come and go    How long can you stand comfortably? on initial standing; will go away with increased standing time    Patient Stated Goals Decrease instances of pain    Currently in Pain? No/denies   5 or 6/10 at worst   Pain Location Back    Pain Orientation Right;Left;Distal    Pain Descriptors / Indicators Dull    Pain Type Chronic pain    Pain Onset More than a month ago    Pain Frequency Intermittent    Pain Relieving Factors "heating pad sorta helps"                 [facility] PT Assessment - 07/12/21 0001       Assessment   Medical Diagnosis M43.17 (ICD-10-CM) - Spondylolisthesis at L5-S1 level    Referring Provider (PT) Silverio Decamp, MD    Prior Therapy PT for intercostal muscles      Precautions   Precautions None      Restrictions   Weight Bearing Restrictions No      Balance Screen   Has the patient fallen in the past 6 months No      [place] residence    Living Arrangements Spouse/significant other;Children    Available Help at Discharge Family    Type of [place]      Prior Function   Level of Independence Independent    Vocation Full time employment    Environmental consultant maintenance      Observation/Other Assessments   Focus on Therapeutic Outcomes ([place])  65 (risk adjusted 50), predicted 79      Functional Tests   Functional tests Squat      Squat   Comments Butt wink at ~80 deg hip flexion, increased lumbar extension      Posture/Postural Control  Posture/Postural Control Postural limitations    Posture Comments L iliac crest appears slightly higher than R; no increased protuberance noted of ASIS or PSIS; posterior glide of sacrum      ROM / Strength   AROM / PROM / Strength Strength;AROM      AROM   AROM Assessment Site Lumbar    Lumbar Flexion WFL    Lumbar Extension WFL    Lumbar - Right Side Bend To knee joint line    Lumbar - Left Side Bend 1" above knee joint line    Lumbar - Right Rotation 90%    Lumbar - Left Rotation 100%      Strength   Strength Assessment Site Hip    Right/Left Hip Right;Left    Right Hip Flexion 4+/5    Right Hip Extension 4+/5    Right Hip ABduction 4/5    Right Hip ADduction 3+/5    Left Hip Flexion 4+/5    Left Hip Extension 4+/5    Left Hip ABduction 3+/5    Left Hip ADduction 4-/5      Palpation   Spinal mobility lumbar spring testing WFL; Prone inferior glide to L posterior iliac shelf and L  superior ischial tuberosity glide are both hypomobile    SI assessment  L upslip ilium;    Palpation comment TTP along L paraspinals and QL vs R      Special Tests    Special Tests Lumbar    Lumbar Tests Prone Knee Bend Test;Straight Leg Raise      Prone Knee Bend Test   Findings Negative      Straight Leg Raise   Findings Negative      Transfers   Comments Transfer sitting to sidelying increased pt's pain on L                        Objective measurements completed on examination: See above findings.       [place] Adult PT Treatment/Exercise - 07/12/21 0001       Exercises   Exercises Lumbar      Lumbar Exercises: Stretches   Other Lumbar Stretch Exercise Sidelying on R, L LE self traction off EOB (see pt instructions) x5 min      Lumbar Exercises: Seated   Other Seated Lumbar Exercises hip hinge to stand x5      Lumbar Exercises: Sidelying   Clam Left;10 reps    Clam Limitations green tband                     PT Education - 07/12/21 1549     Education Details Discussed exam findings, POC, and HEP.    Person(s) Educated Patient    Methods Explanation;Demonstration;Verbal cues;Handout;Tactile cues    Comprehension Verbalized understanding;Returned demonstration;Verbal cues required;Tactile cues required                 PT Long Term Goals - 07/12/21 1556       PT LONG TERM GOAL #1   Title independent with HEP and managing his symptoms    Time 6    Period Weeks    Status New    Target Date 08/23/21      PT LONG TERM GOAL #2   Title Pt will report reduced frequency of his back pain by at least 50%    Baseline ~3x/day    Time 6    Period Weeks    Status New  Target Date 08/23/21      PT LONG TERM GOAL #3   Title Pt will be able to squat at least 20# with good form    Time 6    Period Weeks    Status New    Target Date 08/23/21      PT LONG TERM GOAL #4   Title Pt will have improved FOTO score to at least 79     Baseline 65    Time 6    Period Weeks    Status New    Target Date 08/23/21                    Plan - 07/12/21 1549     Clinical Impression Statement Robert [name] is a 40 y/o M presenting to OPPT due to intermittent LBP. On assessment, pt demos L upslip and bilat posterior glide ilium with weak hip abductors and decreased pelvic stability. Pt would benefit from therapy to address these mobility issues to decrease his pelvic instability and improve body mechanics to reduce any future SIJ malalignments during transfers, ADLs, and leisure tasks.    Personal Factors and Comorbidities Age;Fitness;Time since onset of injury/illness/exacerbation;Past/Current Experience    Examination-Activity Limitations Transfers;Stand;Locomotion Level;Lift;Bend    Examination-Participation Restrictions Community Activity;Occupation;Yard Work    Stability/Clinical Decision Making Stable/Uncomplicated    Clinical Decision Making Low    Rehab Potential Good    PT Frequency 1x / week    PT Duration 6 weeks    PT Treatment/Interventions Cryotherapy;Electrical Stimulation;ADLs/Self Care Home Management;Iontophoresis 58m/ml Dexamethasone;Moist Heat;Gait training;Stair training;Functional mobility training;Therapeutic activities;Therapeutic exercise;Balance training;Neuromuscular re-education;Manual techniques;Patient/family education;Dry needling;Taping    PT Next Visit Plan Assess SI/leg length, Stretching or MET as indicated. Check hip flexor length. Work on hip and core strengthening. Work on bEconomistwith functional lifting.    PT Home Exercise Plan Access Code: [facility] self traction in sidelying and prone stretch under pelvis    Consulted and Agree with Plan of Care Patient             Patient will benefit from skilled therapeutic intervention in order to improve the following deficits and impairments:  Decreased range of motion, Increased fascial restricitons, Decreased coordination,  Increased muscle spasms, Pain, Improper body mechanics, Decreased mobility, Decreased strength, Postural dysfunction  Visit Diagnosis: Abnormal posture  Muscle weakness (generalized)  Other abnormalities of gait and mobility  Chronic low back pain without sciatica, unspecified back pain laterality     Problem List Patient Active Problem List   Diagnosis Date Noted   Spondylolisthesis at L5-S1 level 07/05/2021   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

## 2021-07-12 NOTE — Patient Instructions (Signed)
  Hold for 5 min; perform 1x/day.    2-3" rolled towel longitudinally under pelvis; hold 5 minutes 1x/day

## 2021-07-20 ENCOUNTER — Other Ambulatory Visit: Payer: Self-pay

## 2021-07-20 ENCOUNTER — Ambulatory Visit (INDEPENDENT_AMBULATORY_CARE_PROVIDER_SITE_OTHER): Admitting: Physical Therapy

## 2021-07-20 DIAGNOSIS — M545 Low back pain, unspecified: Secondary | ICD-10-CM | POA: Diagnosis not present

## 2021-07-20 DIAGNOSIS — R293 Abnormal posture: Secondary | ICD-10-CM

## 2021-07-20 DIAGNOSIS — M6281 Muscle weakness (generalized): Secondary | ICD-10-CM | POA: Diagnosis not present

## 2021-07-20 DIAGNOSIS — R2689 Other abnormalities of gait and mobility: Secondary | ICD-10-CM

## 2021-07-20 DIAGNOSIS — G8929 Other chronic pain: Secondary | ICD-10-CM

## 2021-07-20 NOTE — Therapy (Signed)
Robert [name], [place], [address] Phone: [phone]   Fax:  [phone]  Physical Therapy Treatment  Patient Details  Name: Robert [name] MRN: [id number] Date of Birth: Aug 17, 1981 Referring Provider ([name]): Silverio Decamp, MD   Encounter Date: 07/20/2021   [name] End of Session - 07/20/21 1017     Visit Number 2    Number of Visits 6    Date for [name] Re-Evaluation 08/23/21    Authorization Type [employer]    [name] Start Time 1017    [name] Stop Time 1100    [name] Time Calculation (min) 43 min    Activity Tolerance Patient tolerated treatment well    Behavior During Therapy [facility] for tasks assessed/performed             Past Medical History:  Diagnosis Date   Fatty liver 01/07/2017   Seen [place] 12/2016   Hyperlipidemia    Prediabetes 07/07/2017    No past surgical history on file.  There were no vitals filed for this visit.   Subjective Assessment - 07/20/21 1018     Subjective [name] states he's been doing the stretches. Reports no real difference. [name] states some increased back pain this morning.    Limitations Sitting;Standing    How long can you sit comfortably? can come and go    How long can you stand comfortably? on initial standing; will go away with increased standing time    Patient Stated Goals Decrease instances of pain    Currently in Pain? Yes    Pain Score 6     Pain Location Back    Pain Orientation Left    Pain Descriptors / Indicators Dull    Pain Type Chronic pain    Pain Onset More than a month ago                Robert [name] Assessment - 07/20/21 0001       Assessment   Medical Diagnosis M43.17 (ICD-10-CM) - Spondylolisthesis at L5-S1 level    Referring Provider ([name]) Silverio Decamp, MD      Precautions   Precautions None      Restrictions   Weight Bearing Restrictions No                           [facility] Adult [name] Treatment/Exercise - 07/20/21  0001       Lumbar Exercises: Stretches   Other Lumbar Stretch Exercise Sidelying on R, L LE self traction off EOB (see [name] instructions) x5 min    Other Lumbar Stretch Exercise child's pose 2x30 sec and then with lateral flexion x30 sec each      Lumbar Exercises: Aerobic   Recumbent Bike L3 x 5 min      Lumbar Exercises: Seated   Other Seated Lumbar Exercises hip hinge to stand x5      Lumbar Exercises: Supine   Pelvic Tilt 20 reps    Bridge 20 reps;Compliant    Other Supine Lumbar Exercises PPT with march x10 each leg with green tband      Lumbar Exercises: Sidelying   Clam Left;20 reps;Right    Clam Limitations green tband      Lumbar Exercises: Prone   Other Prone Lumbar Exercises Plank 2x30 sec forearm and knees      Manual Therapy   Manual Therapy Joint mobilization    Manual therapy comments [name] prone  Joint Mobilization prolonged hold encouraging anterior rotation on L                     [name] Education - 07/20/21 1108     Education Details Continued to discuss his pelvic alignment and to stretch as needed and then perform core and hip exercises to stabilize.    Person(s) Educated Patient    Methods Explanation;Demonstration;Tactile cues;Verbal cues;Handout    Comprehension Verbalized understanding;Returned demonstration;Verbal cues required;Tactile cues required                 [name] Long Term Goals - 07/12/21 1556       [name] LONG TERM GOAL #1   Title independent with HEP and managing his symptoms    Time 6    Period Weeks    Status New    Target Date 08/23/21      [name] LONG TERM GOAL #2   Title [name] will report reduced frequency of his back pain by at least 50%    Baseline ~3x/day    Time 6    Period Weeks    Status New    Target Date 08/23/21      [name] LONG TERM GOAL #3   Title [name] will be able to squat at least 20# with good form    Time 6    Period Weeks    Status New    Target Date 08/23/21      [name] LONG TERM GOAL #4   Title [name] will  have improved FOTO score to at least 79    Baseline 65    Time 6    Period Weeks    Status New    Target Date 08/23/21                   Plan - 07/20/21 1023     Clinical Impression Statement Reassed [name]'s SI and pelvic alignment -- found L iliac crest to be higher than R in standing and L LE shorter in supine presenting as an upslip L ilium with increased lumbar lordosis and anterior pelvic tilt. Improved after stretching. Continued to work on pelvic and core stability this session. [name] continues to have pain with turning side to side in bed. Reports barely noticing the pain by end of session with improved level.    Personal Factors and Comorbidities Age;Fitness;Time since onset of injury/illness/exacerbation;Past/Current Experience    Examination-Activity Limitations Transfers;Stand;Locomotion Level;Lift;Bend    Examination-Participation Restrictions Community Activity;Occupation;Yard Work    Stability/Clinical Decision Making Stable/Uncomplicated    Rehab Potential Good    [name] Frequency 1x / week    [name] Duration 6 weeks    [name] Treatment/Interventions Cryotherapy;Electrical Stimulation;ADLs/Self Care Home Management;Iontophoresis 68m/ml Dexamethasone;Moist Heat;Gait training;Stair training;Functional mobility training;Therapeutic activities;Therapeutic exercise;Balance training;Neuromuscular re-education;Manual techniques;Patient/family education;Dry needling;Taping    [name] Next Visit Plan Re-assess SI/leg length, Stretching or MET as indicated. Work on posterior pelvic tilt, hip and core strengthening. Work on bEconomistwith functional lifting next session    [name] Home Exercise Plan Access Code: [email or web address]+ self traction in sidelying    Consulted and Agree with Plan of Care Patient             Patient will benefit from skilled therapeutic intervention in order to improve the following deficits and impairments:  Decreased range of motion, Increased fascial restricitons, Decreased  coordination, Increased muscle spasms, Pain, Improper body mechanics, Decreased mobility, Decreased strength, Postural dysfunction  Visit Diagnosis: Abnormal posture  Muscle weakness (  generalized)  Other abnormalities of gait and mobility  Chronic low back pain without sciatica, unspecified back pain laterality     Problem List Patient Active Problem List   Diagnosis Date Noted   Spondylolisthesis at L5-S1 level 07/05/2021   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

## 2021-07-27 ENCOUNTER — Ambulatory Visit (INDEPENDENT_AMBULATORY_CARE_PROVIDER_SITE_OTHER): Admitting: Physical Therapy

## 2021-07-27 ENCOUNTER — Other Ambulatory Visit: Payer: Self-pay

## 2021-07-27 DIAGNOSIS — M545 Low back pain, unspecified: Secondary | ICD-10-CM | POA: Diagnosis not present

## 2021-07-27 DIAGNOSIS — R2689 Other abnormalities of gait and mobility: Secondary | ICD-10-CM

## 2021-07-27 DIAGNOSIS — G8929 Other chronic pain: Secondary | ICD-10-CM

## 2021-07-27 DIAGNOSIS — M6281 Muscle weakness (generalized): Secondary | ICD-10-CM

## 2021-07-27 DIAGNOSIS — R293 Abnormal posture: Secondary | ICD-10-CM | POA: Diagnosis not present

## 2021-07-27 NOTE — Therapy (Signed)
[place], [place], [address] Phone: [phone]   Fax:  [phone]  Physical Therapy Treatment  Patient Details  Name: Robert [name] MRN: [id number] Date of Birth: 05-13-1981 Referring Provider (PT): Silverio Decamp, MD   Encounter Date: 07/27/2021   PT End of Session - 07/27/21 1020     Visit Number 3    Number of Visits 6    Date for PT Re-Evaluation 08/23/21    Authorization Type [employer]    PT Start Time 1020    PT Stop Time 1100    PT Time Calculation (min) 40 min    Activity Tolerance Patient tolerated treatment well    Behavior During Therapy [facility] for tasks assessed/performed             Past Medical History:  Diagnosis Date   Fatty liver 01/07/2017   Seen [place] 12/2016   Hyperlipidemia    Prediabetes 07/07/2017    No past surgical history on file.  There were no vitals filed for this visit.   Subjective Assessment - 07/27/21 1020     Subjective Pt reports he's been more consciencous about his hip positioning. Has found that he likes to cross his left leg in front of his right and lean forward. Notes that pain has become more manageable.    Limitations Sitting;Standing    How long can you sit comfortably? can come and go    How long can you stand comfortably? on initial standing; will go away with increased standing time    Patient Stated Goals Decrease instances of pain    Currently in Pain? Yes    Pain Score 4    sporadic 4/10   Pain Location Back    Pain Orientation Left    Pain Descriptors / Indicators Dull    Pain Type Chronic pain    Pain Onset More than a month ago                               [facility] Adult PT Treatment/Exercise - 07/27/21 0001       Lumbar Exercises: Stretches   Other Lumbar Stretch Exercise Sidelying on R, L LE self traction off EOB (see pt instructions) x5 min      Lumbar Exercises: Aerobic   Tread Mill 2.5 mph  x 5 min      Lumbar Exercises: Standing   Other Standing Lumbar Exercises Hip hike 2x10 each side    Other Standing Lumbar Exercises lateral band walk green tband 2x10      Lumbar Exercises: Seated   Sit to Stand Limitations 3x10 -- 10# chair squat 1st set, 15# 2nd set, 20# 3rd set      Lumbar Exercises: Supine   Other Supine Lumbar Exercises Bridge with small march 1x10    Other Supine Lumbar Exercises bent knee leg lift into knee extension 1x10      Lumbar Exercises: Prone   Other Prone Lumbar Exercises Plank 2x30 sec forearm and toes                          PT Long Term Goals - 07/12/21 1556       PT LONG TERM GOAL #1   Title independent with HEP and managing his symptoms    Time 6    Period Weeks    Status New  Target Date 08/23/21      PT LONG TERM GOAL #2   Title Pt will report reduced frequency of his back pain by at least 50%    Baseline ~3x/day    Time 6    Period Weeks    Status New    Target Date 08/23/21      PT LONG TERM GOAL #3   Title Pt will be able to squat at least 20# with good form    Time 6    Period Weeks    Status New    Target Date 08/23/21      PT LONG TERM GOAL #4   Title Pt will have improved FOTO score to at least 79    Baseline 65    Time 6    Period Weeks    Status New    Target Date 08/23/21                   Plan - 07/27/21 1030     Clinical Impression Statement L iliac crest slightly higher than R on standing. Educated pt on how to find his own pelvic alignment and to fix accordingly. Continued to work on core and pelvic stability. Overall, pt's pain is decreasing and he is continuing to progress towards goals.    Personal Factors and Comorbidities Age;Fitness;Time since onset of injury/illness/exacerbation;Past/Current Experience    Examination-Activity Limitations Transfers;Stand;Locomotion Level;Lift;Bend    Examination-Participation Restrictions Community Activity;Occupation;Yard Work     Stability/Clinical Decision Making Stable/Uncomplicated    Rehab Potential Good    PT Frequency 1x / week    PT Duration 6 weeks    PT Treatment/Interventions Cryotherapy;Electrical Stimulation;ADLs/Self Care Home Management;Iontophoresis 4mg /ml Dexamethasone;Moist Heat;Gait training;Stair training;Functional mobility training;Therapeutic activities;Therapeutic exercise;Balance training;Neuromuscular re-education;Manual techniques;Patient/family education;Dry needling;Taping    PT Next Visit Plan Re-assess SI/leg length, Stretching or MET as indicated. Work on posterior pelvic tilt, hip and core strengthening. Work on Economist with functional lifting next session    PT Home Exercise Plan Access Code: [facility] + self traction in sidelying    Consulted and Agree with Plan of Care Patient             Patient will benefit from skilled therapeutic intervention in order to improve the following deficits and impairments:  Decreased range of motion, Increased fascial restricitons, Decreased coordination, Increased muscle spasms, Pain, Improper body mechanics, Decreased mobility, Decreased strength, Postural dysfunction  Visit Diagnosis: Abnormal posture  Muscle weakness (generalized)  Other abnormalities of gait and mobility  Chronic low back pain without sciatica, unspecified back pain laterality     Problem List Patient Active Problem List   Diagnosis Date Noted   Spondylolisthesis at L5-S1 level 07/05/2021   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

## 2021-08-09 ENCOUNTER — Ambulatory Visit (INDEPENDENT_AMBULATORY_CARE_PROVIDER_SITE_OTHER): Admitting: Physical Therapy

## 2021-08-09 ENCOUNTER — Encounter: Payer: Self-pay | Admitting: Physical Therapy

## 2021-08-09 ENCOUNTER — Other Ambulatory Visit: Payer: Self-pay

## 2021-08-09 DIAGNOSIS — M545 Low back pain, unspecified: Secondary | ICD-10-CM | POA: Diagnosis not present

## 2021-08-09 DIAGNOSIS — R293 Abnormal posture: Secondary | ICD-10-CM | POA: Diagnosis not present

## 2021-08-09 DIAGNOSIS — R2689 Other abnormalities of gait and mobility: Secondary | ICD-10-CM

## 2021-08-09 DIAGNOSIS — M6281 Muscle weakness (generalized): Secondary | ICD-10-CM | POA: Diagnosis not present

## 2021-08-09 DIAGNOSIS — G8929 Other chronic pain: Secondary | ICD-10-CM

## 2021-08-09 NOTE — Therapy (Signed)
[place], [place], [address] Phone: [phone]   Fax:  [phone]  Physical Therapy Treatment  Patient Details  Name: Robert [name] MRN: [id number] Date of Birth: 10/17/1980 Referring Provider (PT): Silverio Decamp, MD   Encounter Date: 08/09/2021   PT End of Session - 08/09/21 0929     Visit Number 4    Number of Visits 6    Date for PT Re-Evaluation 08/23/21    Authorization Type [employer]    PT Start Time 0930    PT Stop Time 5400    PT Time Calculation (min) 44 min    Activity Tolerance Patient tolerated treatment well    Behavior During Therapy [facility] for tasks assessed/performed             Past Medical History:  Diagnosis Date   Fatty liver 01/07/2017   Seen [place] 12/2016   Hyperlipidemia    Prediabetes 07/07/2017    History reviewed. No pertinent surgical history.  There were no vitals filed for this visit.   Subjective Assessment - 08/09/21 0930     Subjective Pain continues to be sporadic and fluctuates- doesn't last as long and not as intense. He's trying to change his posture at work.  HEP completed 2x/wk and gym 2x/wk.    Currently in Pain? No/denies    Pain Score 0-No pain                [facility] PT Assessment - 08/09/21 0001       Assessment   Medical Diagnosis M43.17 (ICD-10-CM) - Spondylolisthesis at L5-S1 level    Referring Provider (PT) Silverio Decamp, MD      Posture/Postural Control   Posture Comments ASIS and iliac crests level in supine and standing              [facility] Adult PT Treatment/Exercise - 08/09/21 0001       Bed Mobility   Bed Mobility --   reviewed log roll to supine.     Lumbar Exercises: Stretches   Passive Hamstring Stretch Right;Left;2 reps;30 seconds   seated with hip hinge   Hip Flexor Stretch Left;2 reps;Right;1 rep;20 seconds   seated with leg back, trial of arm up overhead.   Piriformis Stretch Right;Left;2  reps;20 seconds      Lumbar Exercises: Aerobic   Tread Mill 2.5-3.0 x 6 min for warm up      Lumbar Exercises: Seated   Sit to Stand Limitations 2 x 10 lifting 20# KB to floor (low black mat)      Lumbar Exercises: Prone   Opposite Arm/Leg Raise Right arm/Left leg;Left arm/Right leg;10 reps;2 seconds    Other Prone Lumbar Exercises Plank forearm and toes, 20 sec, 30 sec.      Lumbar Exercises: Quadruped   Other Quadruped Lumbar Exercises childs pose with lateral flexion x 10 sec each side.                PT Long Term Goals - 08/09/21 8676       PT LONG TERM GOAL #1   Title independent with HEP and managing his symptoms    Time 6    Period Weeks    Status On-going      PT LONG TERM GOAL #2   Title Pt will report reduced frequency of his back pain by at least 50%    Baseline ~3x/day    Time 6    Period Weeks  Status Achieved      PT LONG TERM GOAL #3   Title Pt will be able to squat at least 20# with good form    Time 6    Period Weeks    Status New      PT LONG TERM GOAL #4   Title Pt will have improved FOTO score to at least 79    Baseline 65    Time 6    Period Weeks    Status New                   Plan - 08/09/21 0951     Clinical Impression Statement Pt overall reporting reduction of freq and intensity of back pain; managing symptoms with stretches.  He complained of clicking and discomfort when extending the LLE into knee ext with supine bent knee lifts; stopped and discomfort resolved.  Otherwise, pt tolerated other exercises well without production of pain, just fatigue.  Progressing well towards remaining goals.    Personal Factors and Comorbidities Age;Fitness;Time since onset of injury/illness/exacerbation;Past/Current Experience    Examination-Activity Limitations Transfers;Stand;Locomotion Level;Lift;Bend    Examination-Participation Restrictions Community Activity;Occupation;Yard Work    Stability/Clinical Decision Making  Stable/Uncomplicated    Rehab Potential Good    PT Frequency 1x / week    PT Duration 6 weeks    PT Treatment/Interventions Cryotherapy;Electrical Stimulation;ADLs/Self Care Home Management;Iontophoresis 4mg /ml Dexamethasone;Moist Heat;Gait training;Stair training;Functional mobility training;Therapeutic activities;Therapeutic exercise;Balance training;Neuromuscular re-education;Manual techniques;Patient/family education;Dry needling;Taping    PT Next Visit Plan Re-assess SI/leg length, Stretching or MET as indicated. Work on posterior pelvic, hip and core strengthening.    PT Home Exercise Plan Access Code: [facility] + self traction in sidelying    Consulted and Agree with Plan of Care Patient             Patient will benefit from skilled therapeutic intervention in order to improve the following deficits and impairments:  Decreased range of motion, Increased fascial restricitons, Decreased coordination, Increased muscle spasms, Pain, Improper body mechanics, Decreased mobility, Decreased strength, Postural dysfunction  Visit Diagnosis: Abnormal posture  Muscle weakness (generalized)  Other abnormalities of gait and mobility  Chronic low back pain without sciatica, unspecified back pain laterality     Problem List Patient Active Problem List   Diagnosis Date Noted   Spondylolisthesis at L5-S1 level 07/05/2021   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

## 2021-08-15 ENCOUNTER — Ambulatory Visit (INDEPENDENT_AMBULATORY_CARE_PROVIDER_SITE_OTHER): Admitting: Physical Therapy

## 2021-08-15 ENCOUNTER — Other Ambulatory Visit: Payer: Self-pay

## 2021-08-15 ENCOUNTER — Encounter: Payer: Self-pay | Admitting: Physical Therapy

## 2021-08-15 DIAGNOSIS — M545 Low back pain, unspecified: Secondary | ICD-10-CM

## 2021-08-15 DIAGNOSIS — G8929 Other chronic pain: Secondary | ICD-10-CM

## 2021-08-15 DIAGNOSIS — R2689 Other abnormalities of gait and mobility: Secondary | ICD-10-CM | POA: Diagnosis not present

## 2021-08-15 DIAGNOSIS — R293 Abnormal posture: Secondary | ICD-10-CM

## 2021-08-15 DIAGNOSIS — M6281 Muscle weakness (generalized): Secondary | ICD-10-CM | POA: Diagnosis not present

## 2021-08-15 NOTE — Therapy (Addendum)
[facility] [place] [facility], [place], [address] Phone: [phone]   Fax:  [phone]  Physical Therapy Treatment and Discharge  Patient Details  Name: Robert [name] MRN: [id number] Date of Birth: 12-Apr-1981 Referring Provider (PT): Silverio Decamp, MD  PHYSICAL THERAPY DISCHARGE SUMMARY  Visits from Start of Care: 5  Current functional level related to goals / functional outcomes: See below. No calls received from patient for any exacerbation or needs to progress his exercises. Ready for d/c.   Remaining deficits: Pt has met all goals   Education / Equipment: Has been given education on continuing his exercises and stabilization   Patient agrees to discharge. Patient goals were met. Patient is being discharged due to meeting the stated rehab goals. And pleased with current level of function.   Encounter Date: 08/15/2021   PT End of Session - 08/15/21 1621     Visit Number 5    Number of Visits 6    Date for PT Re-Evaluation 08/23/21    Authorization Type [employer]    PT Start Time 1605    PT Stop Time 6378    PT Time Calculation (min) 42 min    Activity Tolerance Patient tolerated treatment well    Behavior During Therapy [facility] for tasks assessed/performed             Past Medical History:  Diagnosis Date   Fatty liver 01/07/2017   Seen [place] 12/2016   Hyperlipidemia    Prediabetes 07/07/2017    History reviewed. No pertinent surgical history.  There were no vitals filed for this visit.   Subjective Assessment - 08/15/21 1616     Subjective Pt reports he has been painfree since Sunday. He hasn't had a chance to do his exercises as his schedule has been busy.    Patient Stated Goals Decrease instances of pain    Currently in Pain? No/denies    Pain Score 0-No pain                [facility] PT Assessment - 08/15/21 0001       Assessment   Medical Diagnosis M43.17 (ICD-10-CM) -  Spondylolisthesis at L5-S1 level    Referring Provider (PT) Silverio Decamp, MD      Observation/Other Assessments   Focus on Therapeutic Outcomes (FOTO)  94 functional score      Strength   Right Hip Flexion 5/5    Right Hip ABduction 4+/5    Right Hip ADduction 5/5    Left Hip Flexion 5/5    Left Hip ABduction 4/5    Left Hip ADduction 5/5               [facility] Adult PT Treatment/Exercise - 08/15/21 0001       Lumbar Exercises: Stretches   Hip Flexor Stretch Right;Left;1 rep;30 seconds   seated   Other Lumbar Stretch Exercise Sidelying on R, L LE self traction off EOB, 30 sec x 2 reps    Other Lumbar Stretch Exercise open book x 3 reps each side      Lumbar Exercises: Aerobic   Tread Mill 2.5-3.0 x 5 min for warm up      Lumbar Exercises: Supine   Bridge 15 reps;4 seconds   feet on green pball     Lumbar Exercises: Prone   Opposite Arm/Leg Raise Right arm/Left leg;Left arm/Right leg;10 reps;2 seconds    Other Prone Lumbar Exercises Plank forearm and toes, 20 sec, 30  sec.            Verbally and visually reviewed other exercises of HEP.      PT Education - 08/15/21 1705     Education Details HEP    Person(s) Educated Patient    Methods Explanation;Demonstration;Tactile cues;Verbal cues;Handout    Comprehension Verbalized understanding;Returned demonstration                 PT Long Term Goals - 08/15/21 1620       PT LONG TERM GOAL #1   Title independent with HEP and managing his symptoms    Time 6    Period Weeks    Status Achieved      PT LONG TERM GOAL #2   Title Pt will report reduced frequency of his back pain by at least 50%    Baseline ~3x/day    Time 6    Period Weeks    Status Achieved      PT LONG TERM GOAL #3   Title Pt will be able to squat at least 20# with good form    Time 6    Period Weeks    Status Achieved      PT LONG TERM GOAL #4   Title Pt will have improved FOTO score to at least 79    Baseline 65     Time 6    Period Weeks    Status Achieved                   Plan - 08/15/21 1705     Clinical Impression Statement Pt tolerated exercises well without production of pain or symptoms.  Pt has met his goals and is pleased with level of function. Agreeable to hold PT and continue working on HEP independently. Will call if flare up/ or needs progression of exercises.    Personal Factors and Comorbidities Age;Fitness;Time since onset of injury/illness/exacerbation;Past/Current Experience    Examination-Activity Limitations Transfers;Stand;Locomotion Level;Lift;Bend    Examination-Participation Restrictions Community Activity;Occupation;Yard Work    Stability/Clinical Decision Making Stable/Uncomplicated    Rehab Potential Good    PT Frequency 1x / week    PT Duration 6 weeks    PT Treatment/Interventions Cryotherapy;Electrical Stimulation;ADLs/Self Care Home Management;Iontophoresis 75m/ml Dexamethasone;Moist Heat;Gait training;Stair training;Functional mobility training;Therapeutic activities;Therapeutic exercise;Balance training;Neuromuscular re-education;Manual techniques;Patient/family education;Dry needling;Taping    PT Next Visit Plan Hold per pt request; will d/c if pt doesn't return by 09/07/21    PT Home Exercise Plan [facility] traction in sidelying    Consulted and Agree with Plan of Care Patient             Patient will benefit from skilled therapeutic intervention in order to improve the following deficits and impairments:  Decreased range of motion, Increased fascial restricitons, Decreased coordination, Increased muscle spasms, Pain, Improper body mechanics, Decreased mobility, Decreased strength, Postural dysfunction  Visit Diagnosis: Abnormal posture  Muscle weakness (generalized)  Other abnormalities of gait and mobility  Chronic low back pain without sciatica, unspecified back pain laterality     Problem List Patient Active Problem  List   Diagnosis Date Noted   Spondylolisthesis at L5-S1 level 07/05/2021   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

## 2021-08-15 NOTE — Patient Instructions (Signed)
Access Code: [facility] URL: [email or web address] Date: 08/15/2021 Prepared by: [place] on Knees - 1 x daily - 7 x weekly - 2 sets - 30 sec hold Squat with Chair Touch - 1 x daily - 7 x weekly - 3 sets - 10 reps Side Stepping with Resistance at Ankles - 1 x daily - 3 x weekly - 3 sets - 10 reps Single Leg Bridge - 1 x daily - 3 x weekly - 2 sets - 10 reps Prone Alternating Arm and Leg Lifts - 1 x daily - 3 x weekly - 1 sets - 10 reps Sidelying ITB Stretch off Table - 1 x daily - 7 x weekly - 1 sets - 2-3 reps - 30 seconds hold Sidelying Thoracic Rotation with Open Book - 1 x daily - 7 x weekly - 1 sets - 3 reps

## 2021-08-16 ENCOUNTER — Encounter: Admitting: Physical Therapy

## 2021-08-16 ENCOUNTER — Ambulatory Visit: Admitting: Sports Medicine

## 2021-08-17 ENCOUNTER — Ambulatory Visit (INDEPENDENT_AMBULATORY_CARE_PROVIDER_SITE_OTHER): Admitting: Sports Medicine

## 2021-08-17 ENCOUNTER — Other Ambulatory Visit: Payer: Self-pay

## 2021-08-17 DIAGNOSIS — M4317 Spondylolisthesis, lumbosacral region: Secondary | ICD-10-CM | POA: Diagnosis not present

## 2021-08-17 NOTE — Progress Notes (Signed)
    Procedures performed today:    None.  Independent interpretation of notes and tests performed by another provider:   None.  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    ___________________________________________ Gwen Her. Dianah Field, M.D., ABFM., CAQSM. Primary Care and [place] Instructor of [employer] [facility] [employer]

## 2021-08-17 NOTE — Assessment & Plan Note (Signed)
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

## 2021-11-08 IMAGING — DX DG CHEST 2V
2 series · 2 of 2 positions shown · non-contrast
Comparison: 01/27/2014

CLINICAL DATA: Cough for 1 week

EXAM:
CHEST - 2 VIEW

[chest pa]
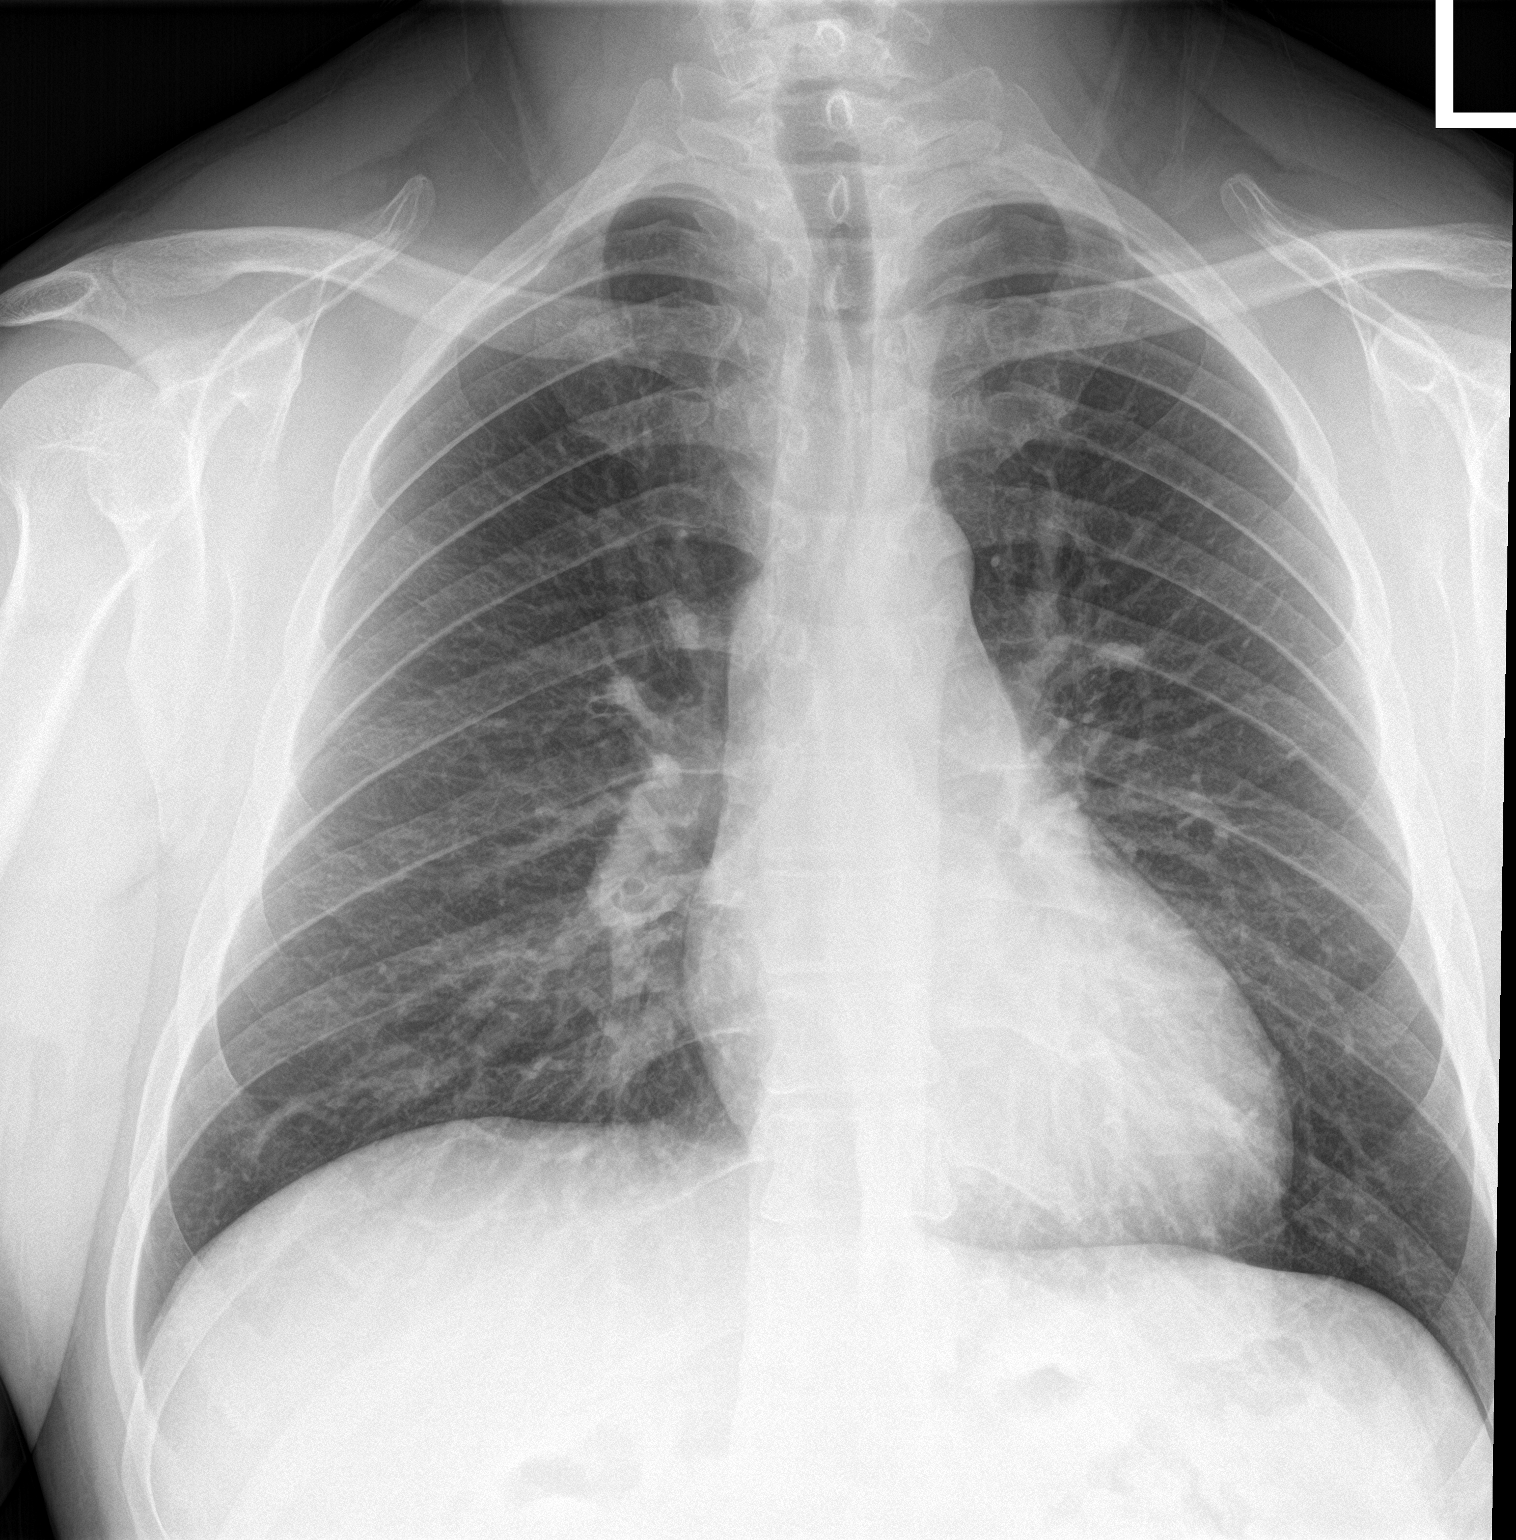

[chest lat]
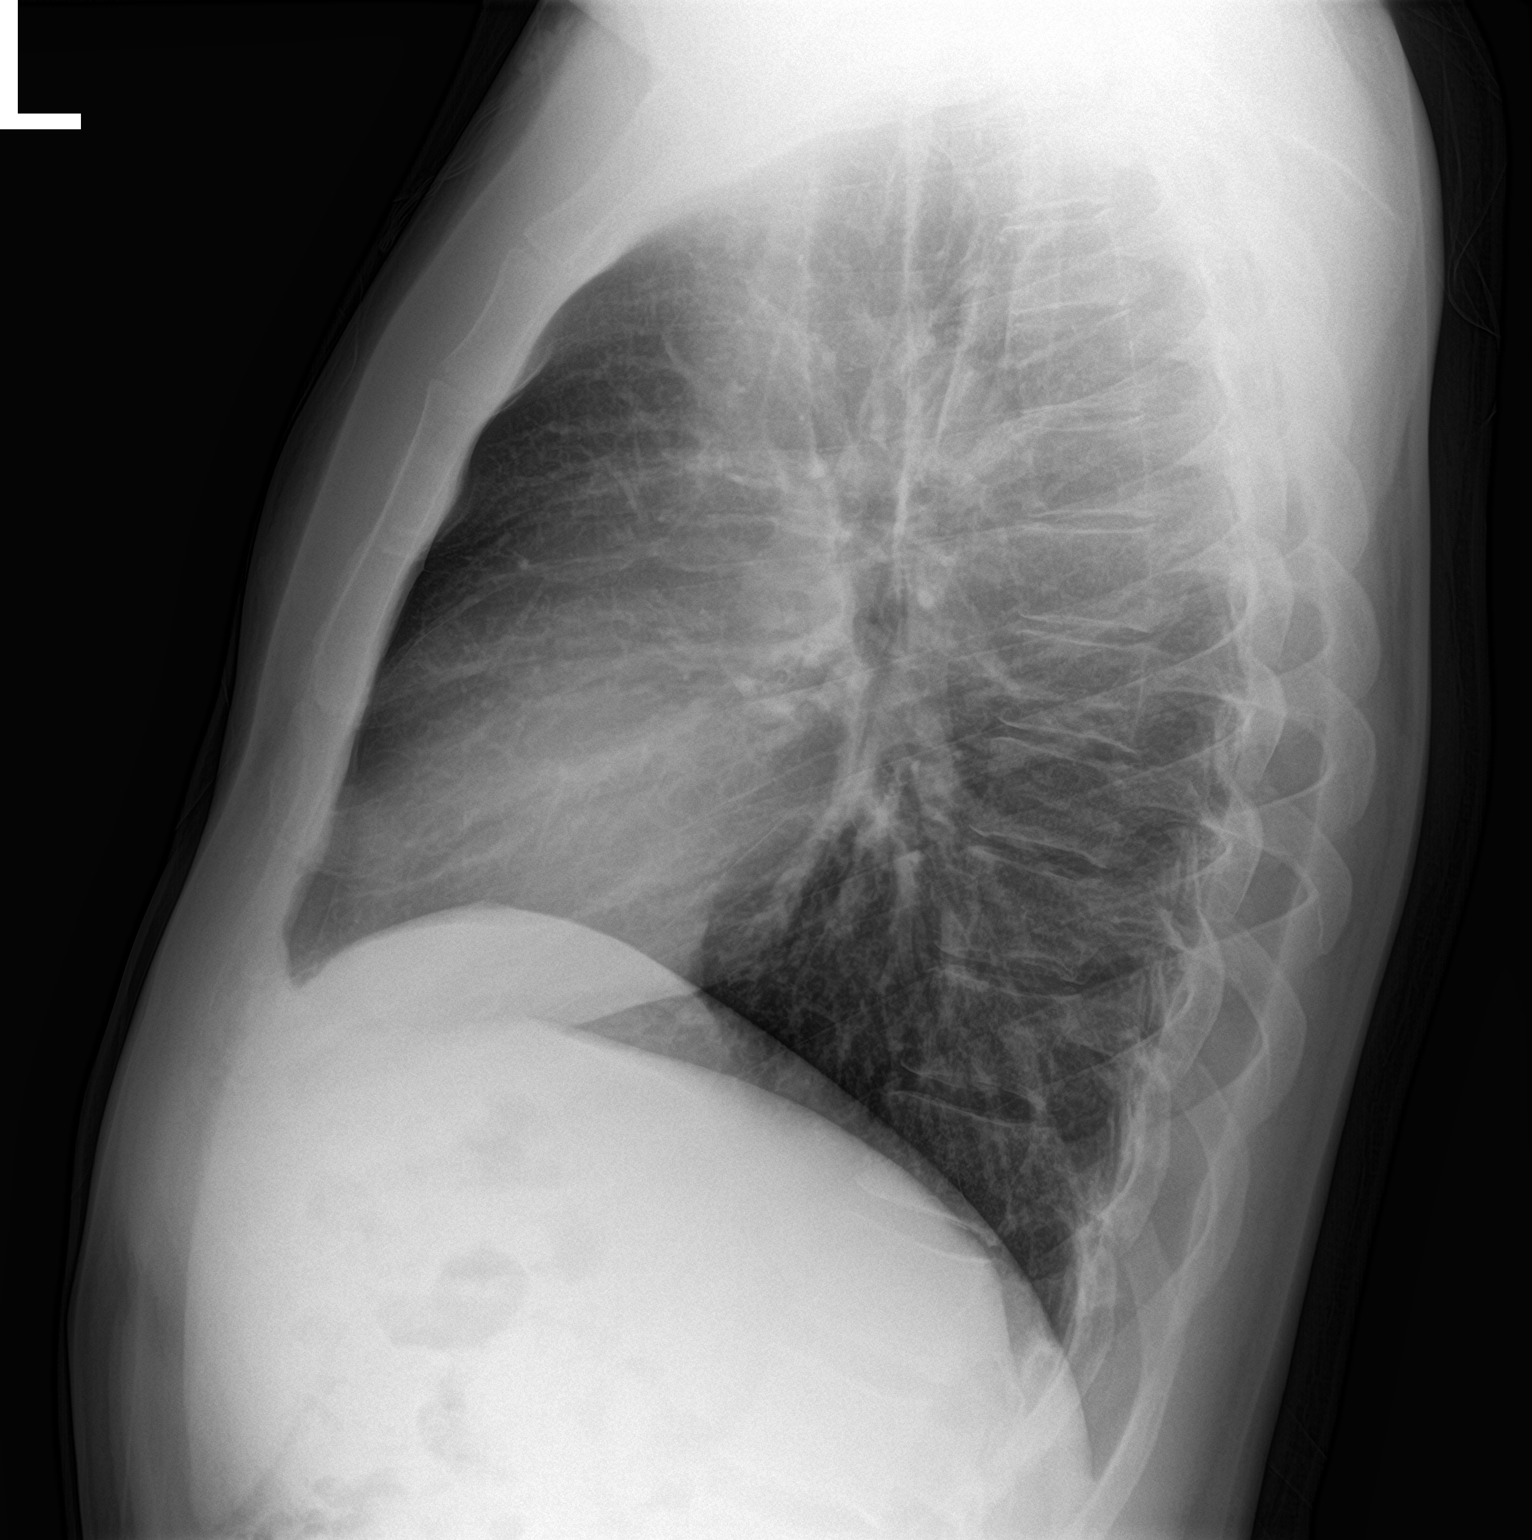

[2 of 2 positions shown; findings below may reference images not displayed]

FINDINGS: The heart size and mediastinal contours are within normal limits. No
focal airspace consolidation, pleural effusion, or pneumothorax. The
visualized skeletal structures are unremarkable.
IMPRESSION: No active cardiopulmonary disease.

## 2022-04-14 ENCOUNTER — Ambulatory Visit
Admission: EM | Admit: 2022-04-14 | Discharge: 2022-04-14 | Disposition: A | Discharge status: Home / Self Care | Attending: Family Medicine | Admitting: Family Medicine

## 2022-04-14 ENCOUNTER — Encounter: Payer: Self-pay | Admitting: Emergency Medicine

## 2022-04-14 DIAGNOSIS — T7840XA Allergy, unspecified, initial encounter: Secondary | ICD-10-CM | POA: Diagnosis not present

## 2022-04-14 DIAGNOSIS — W57XXXA Bitten or stung by nonvenomous insect and other nonvenomous arthropods, initial encounter: Secondary | ICD-10-CM

## 2022-04-14 DIAGNOSIS — S90562A Insect bite (nonvenomous), left ankle, initial encounter: Secondary | ICD-10-CM | POA: Diagnosis not present

## 2022-04-14 MED ORDER — METHYLPREDNISOLONE ACETATE 80 MG/ML IJ SUSP
80.0000 mg | Freq: Once | INTRAMUSCULAR | Status: AC
  Administered 2022-04-14: 80 mg via INTRAMUSCULAR

## 2022-04-14 NOTE — ED Provider Notes (Signed)
[name]    CSN: [id number] Arrival date & time: 04/14/22  1257      History   Chief Complaint Chief Complaint  Patient presents with   Insect Bite    HPI Robert [name] is a 41 y.o. male.   HPI  Patient got multiple bites while he was mowing the lawn yesterday.  He did this around 10 AM.  By 6 PM he noticed that he had itching swelling discomfort and a tight feeling around his left ankle.  It is red and swollen.  He is here for evaluation.  He is never had this before.  He does not know what bit him.  No malaise, fever, joint aches.  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 No pertinent surgical history.     Home Medications    Prior to Admission medications   Not on File    Family History Family History  Problem Relation Age of Onset   Cancer Father        prostate   Healthy Mother     Social History Social History   Tobacco Use   Smoking status: Every Day    Packs/day: 0.50    Years: 10.00    Total pack years: 5.00    Types: Cigarettes   Smokeless tobacco: Never   Tobacco comments:    using chantix  Vaping Use   Vaping Use: Never used  Substance Use Topics   Alcohol use: Yes    Alcohol/week: 2.0 standard drinks of alcohol    Types: 2 Standard drinks or equivalent per week   Drug use: No     Allergies   Patient has no known allergies.   Review of Systems Review of Systems   Physical Exam Triage Vital Signs ED Triage Vitals  Enc Vitals Group     BP  04/14/22 1317 112/74     Pulse Rate 04/14/22 1317 88     Resp 04/14/22 1317 14     Temp 04/14/22 1317 99.1 F (37.3 C)     Temp Source 04/14/22 1317 Oral     SpO2 04/14/22 1317 96 %     Weight 04/14/22 1320 240 lb (108.9 kg)     Height 04/14/22 1320 '6\' 5"'$  (1.956 m)     Head Circumference --      Peak Flow --      Pain Score 04/14/22 1319 2     Pain Loc --      Pain Edu? --      Excl. in [place]? --    No data found.  Updated Vital Signs BP 112/74 (BP Location: Left Arm)   Pulse 88   Temp 99.1 F (37.3 C) (Oral)   Resp 14   Ht '6\' 5"'$  (1.956 m)   Wt 108.9 kg   SpO2 96%   BMI 28.46 kg/m      Physical Exam Constitutional:      General: He is not in acute distress.  Appearance: He is well-developed. He is not ill-appearing.  HENT:     Head: Normocephalic and atraumatic.  Eyes:     Conjunctiva/sclera: Conjunctivae normal.     Pupils: Pupils are equal, round, and reactive to light.  Cardiovascular:     Rate and Rhythm: Normal rate.     Heart sounds: Normal heart sounds.  Pulmonary:     Effort: Pulmonary effort is normal. No respiratory distress.     Breath sounds: Normal breath sounds.  Abdominal:     General: There is no distension.     Palpations: Abdomen is soft.  Musculoskeletal:        General: Normal range of motion.     Cervical back: Normal range of motion.  Skin:    General: Skin is warm and dry.     Comments: The left ankle has several areas that appear to be bites.  They are 1 cm across, very slightly raised.  The centers have eschar.  There is also a couple of small pustules on the lower ankle.  The entire ankle from the mid shin down his puffy, slightly erythematous, nontender.  Neurological:     Mental Status: He is alert.      UC Treatments / Results  Labs (all labs ordered are listed, but only abnormal results are displayed) Labs Reviewed - No data to display  EKG   Radiology No results found.  Procedures Procedures (including critical  care time)  Medications Ordered in UC Medications  methylPREDNISolone acetate (DEPO-MEDROL) injection 80 mg (has no administration in time range)    Initial Impression / Assessment and Plan / UC Course  I have reviewed the triage vital signs and the nursing notes.  Pertinent labs & imaging results that were available during my care of the patient were reviewed by me and considered in my medical decision making (see chart for details).     I explained that this was an allergic reaction to one of her insect bit him.  Its not an infection that requires antibiotics.  We will treat with prednisone and antihistamines Final Clinical Impressions(s) / UC Diagnoses   Final diagnoses:  Allergic reaction, initial encounter  Insect bite of left ankle, initial encounter     Discharge Instructions      You may take antihistamines for the itching and discomfort.  Take Claritin or Zyrtec in the morning, take Benadryl at night we have given you a shot of prednisone to take down the swelling and inflammatio staff your feet while your ankle swollen Call for problems   ED Prescriptions   None    PDMP not reviewed this encounter.   Raylene Everts, MD 04/14/22 1345

## 2022-04-14 NOTE — Discharge Instructions (Signed)
You may take antihistamines for the itching and discomfort.  Take Claritin or Zyrtec in the morning, take Benadryl at night we have given you a shot of prednisone to take down the swelling and inflammatio staff your feet while your ankle swollen Call for problems

## 2022-04-14 NOTE — ED Triage Notes (Addendum)
Pt was mowing the lawn yesterday and noticed a rash to his LLE Complaining of itching and swelling above ankle on left - wonders if it was an ant bite - reaction in past  Warm to touch Pt has some older scabbed areas that are red - treated with aquaphor & neosprin

## 2022-05-11 ENCOUNTER — Ambulatory Visit
Admission: EM | Admit: 2022-05-11 | Discharge: 2022-05-11 | Disposition: A | Discharge status: Home / Self Care | Attending: Family Medicine | Admitting: Family Medicine

## 2022-05-11 ENCOUNTER — Encounter: Payer: Self-pay | Admitting: Emergency Medicine

## 2022-05-11 DIAGNOSIS — S61231A Puncture wound without foreign body of left index finger without damage to nail, initial encounter: Secondary | ICD-10-CM | POA: Diagnosis not present

## 2022-05-11 MED ORDER — CEPHALEXIN 500 MG PO CAPS: 500.0000 mg | ORAL_CAPSULE | Freq: Two times a day (BID) | ORAL | 0 refills | Status: DC

## 2022-05-11 NOTE — ED Provider Notes (Signed)
[name]    CSN: [id number] Arrival date & time: 05/11/22  2841      History   Chief Complaint Chief Complaint  Patient presents with   Finger Injury    HPI Robert [name] is a 41 y.o. male.   HPI\\  Patient had was using a nail gun yesterday and accidentally punctured his index finger left hand.  He has a small wound.  Mildly painful.  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 No pertinent surgical history.     Home Medications    Prior to Admission medications   Medication Sig Start Date End Date Taking? Authorizing Provider  cephALEXin (KEFLEX) 500 MG capsule Take 1 capsule (500 mg total) by mouth 2 (two) times daily. 05/11/22  Yes Raylene Everts, MD    Family History Family History  Problem Relation Age of Onset   Cancer Father        prostate   Healthy Mother     Social History Social History   Tobacco Use   Smoking status: Some Days    Packs/day: 0.50    Years: 10.00    Total pack years: 5.00    Types: Cigarettes   Smokeless tobacco: Never   Tobacco comments:    using chantix  Vaping Use   Vaping Use: Never used  Substance Use Topics   Alcohol use: Yes    Alcohol/week: 2.0 standard drinks of alcohol    Types: 2 Standard drinks or equivalent per week   Drug use: No     Allergies   Patient has no known allergies.   Review of Systems Review of Systems  See HPI Physical Exam Triage Vital Signs ED Triage Vitals  Enc Vitals  Group     BP 05/11/22 0822 114/75     Pulse Rate 05/11/22 0822 62     Resp 05/11/22 0822 18     Temp 05/11/22 0822 99.1 F (37.3 C)     Temp Source 05/11/22 0822 Oral     SpO2 05/11/22 0822 97 %     Weight 05/11/22 0823 230 lb (104.3 kg)     Height 05/11/22 0823 '6\' 5"'$  (1.956 m)     Head Circumference --      Peak Flow --      Pain Score 05/11/22 0823 2     Pain Loc --      Pain Edu? --      Excl. in [place]? --    No data found.  Updated Vital Signs BP 114/75 (BP Location: Right Arm)   Pulse 62   Temp 99.1 F (37.3 C) (Oral)   Resp 18   Ht '6\' 5"'$  (1.956 m)   Wt 104.3 kg   SpO2 97%   BMI 27.27 kg/m     Physical Exam Constitutional:      General: He is not in acute distress.    Appearance: He is well-developed.  HENT:  Head: Normocephalic and atraumatic.  Eyes:     Conjunctiva/sclera: Conjunctivae normal.     Pupils: Pupils are equal, round, and reactive to light.  Cardiovascular:     Rate and Rhythm: Normal rate.  Pulmonary:     Effort: Pulmonary effort is normal. No respiratory distress.  Abdominal:     General: There is no distension.     Palpations: Abdomen is soft.  Musculoskeletal:        General: Normal range of motion.     Cervical back: Normal range of motion.  Skin:    General: Skin is warm and dry.     Comments: On the pad of the left index finger tip there is a small puncture wound.  No bony tenderness  Neurological:     General: No focal deficit present.     Mental Status: He is alert.  Psychiatric:        Mood and Affect: Mood normal.        Behavior: Behavior normal.      UC Treatments / Results  Labs (all labs ordered are listed, but only abnormal results are displayed) Labs Reviewed - No data to display  EKG   Radiology No results found.  Procedures Procedures (including critical care time)  Medications Ordered in UC Medications - No data to display  Initial Impression / Assessment and Plan / UC Course  I have reviewed the  triage vital signs and the nursing notes.  Pertinent labs & imaging results that were available during my care of the patient were reviewed by me and considered in my medical decision making (see chart for details).    By examination I do not suspect bone fracture, but cannot ascertain the depth of the wound.  Will cover with antibiotics to prevent infection.  Final Clinical Impressions(s) / UC Diagnoses   Final diagnoses:  Puncture wound of left index finger     Discharge Instructions      Keep area clean. Watch for infection Take antibiotic 2 times a day for 5 days     ED Prescriptions     Medication Sig Dispense Auth. Provider   cephALEXin (KEFLEX) 500 MG capsule Take 1 capsule (500 mg total) by mouth 2 (two) times daily. 10 capsule Raylene Everts, MD      PDMP not reviewed this encounter.   Raylene Everts, MD 05/11/22 [phone]

## 2022-05-11 NOTE — ED Triage Notes (Addendum)
Patient states that he was working with a nail gun yesterday and a nail went into his left index finger.  Some bleeding, some pain and swelling.  Denies any OTC pain meds.  Last Tdap in 2019

## 2022-05-11 NOTE — Discharge Instructions (Signed)
Keep area clean. Watch for infection Take antibiotic 2 times a day for 5 days

## 2022-07-11 ENCOUNTER — Encounter: Payer: Self-pay | Admitting: Sports Medicine

## 2022-07-11 ENCOUNTER — Ambulatory Visit: Admitting: Sports Medicine

## 2022-07-11 ENCOUNTER — Ambulatory Visit (INDEPENDENT_AMBULATORY_CARE_PROVIDER_SITE_OTHER)

## 2022-07-11 VITALS — Wt 239.0 lb

## 2022-07-11 DIAGNOSIS — Z09 Encounter for follow-up examination after completed treatment for conditions other than malignant neoplasm: Secondary | ICD-10-CM | POA: Diagnosis not present

## 2022-07-11 DIAGNOSIS — S83207A Unspecified tear of unspecified meniscus, current injury, left knee, initial encounter: Secondary | ICD-10-CM

## 2022-07-11 DIAGNOSIS — M25562 Pain in left knee: Secondary | ICD-10-CM

## 2022-07-11 MED ORDER — MELOXICAM 15 MG PO TABS: ORAL_TABLET | ORAL | 3 refills | Status: DC

## 2022-07-11 NOTE — Assessment & Plan Note (Signed)
This is a very pleasant 41 year old male, he was doing some sprinting, and unfortunately started to develop severe pain in his knee, with mechanical symptoms in the form of clicking and catching. On exam he has mild swelling, ligaments are stable but he does have pain with terminal flexion, I would consider this a positive McMurray's sign for insurance coverage purposes. Considering all the above we will treat him aggressively and investigate aggressively, x-rays today, meloxicam, he will get a knee sleeve over-the-counter, I would like an MRI to evaluate for meniscal tear. Treatment will depend on whether this is a degenerative meniscal tear or not.

## 2022-07-11 NOTE — Progress Notes (Signed)
    Procedures performed today:    None.  Independent interpretation of notes and tests performed by another provider:   None.  Brief History, Exam, Impression, and Recommendations:    Acute meniscal tear of left knee This is a very pleasant 41 year old male, he was doing some sprinting, and unfortunately started to develop severe pain in his knee, with mechanical symptoms in the form of clicking and catching. On exam he has mild swelling, ligaments are stable but he does have pain with terminal flexion, I would consider this a positive McMurray's sign for insurance coverage purposes. Considering all the above we will treat him aggressively and investigate aggressively, x-rays today, meloxicam, he will get a knee sleeve over-the-counter, I would like an MRI to evaluate for meniscal tear. Treatment will depend on whether this is a degenerative meniscal tear or not.    ____________________________________________ Gwen Her. Dianah Field, M.D., ABFM., CAQSM., AME. Primary Care and Sports Medicine [facility]  Adjunct Professor of [place] [employer] [facility] of Medicine  Risk manager

## 2022-07-14 ENCOUNTER — Ambulatory Visit (INDEPENDENT_AMBULATORY_CARE_PROVIDER_SITE_OTHER)

## 2022-07-14 DIAGNOSIS — S83207A Unspecified tear of unspecified meniscus, current injury, left knee, initial encounter: Secondary | ICD-10-CM

## 2023-03-17 IMAGING — DX DG LUMBAR SPINE BEND(FLEX/EXT) ONLY 2-3 V
3 series · 3 of 3 positions shown · non-contrast
Comparison: 02/06/2016

CLINICAL DATA: Chronic low back pain.  No trauma.

EXAM:
LUMBAR SPINE FLEX AND EXTEND ONLY - 2-3 VIEW

[l-spine flex]
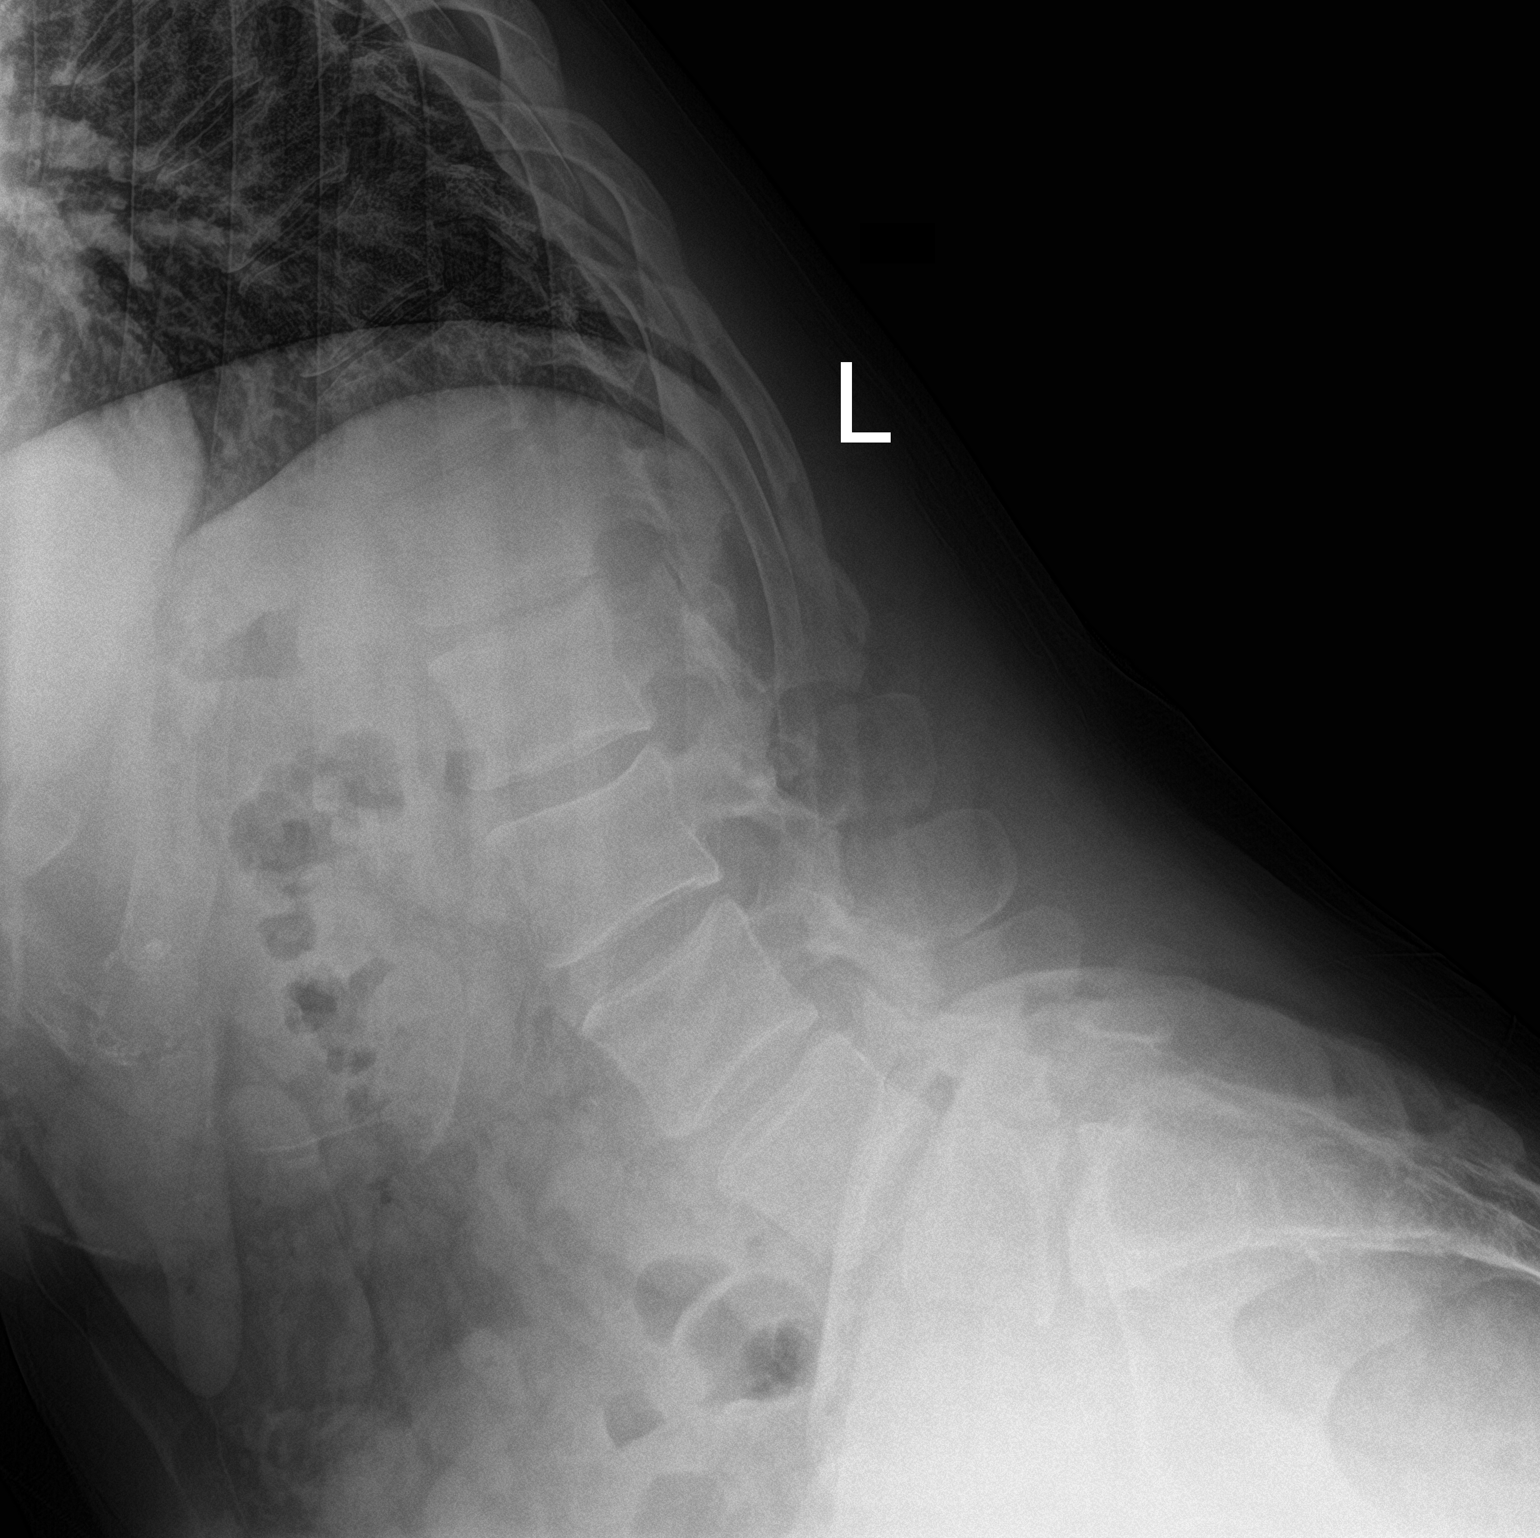

[l-spine lat]
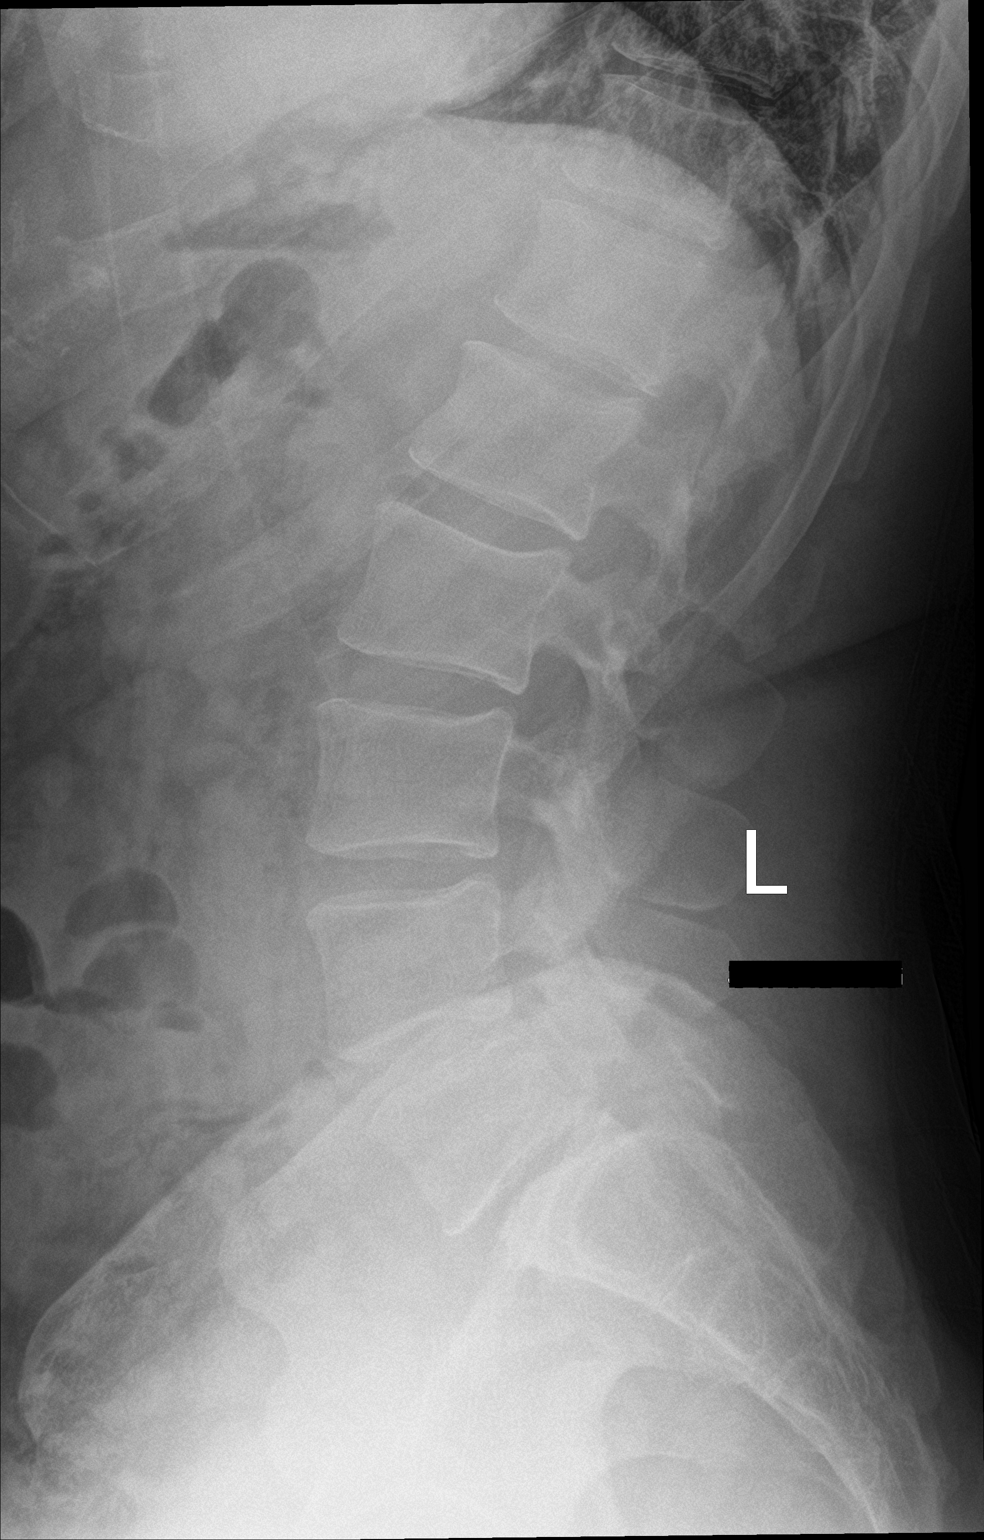

[l-spine ext]
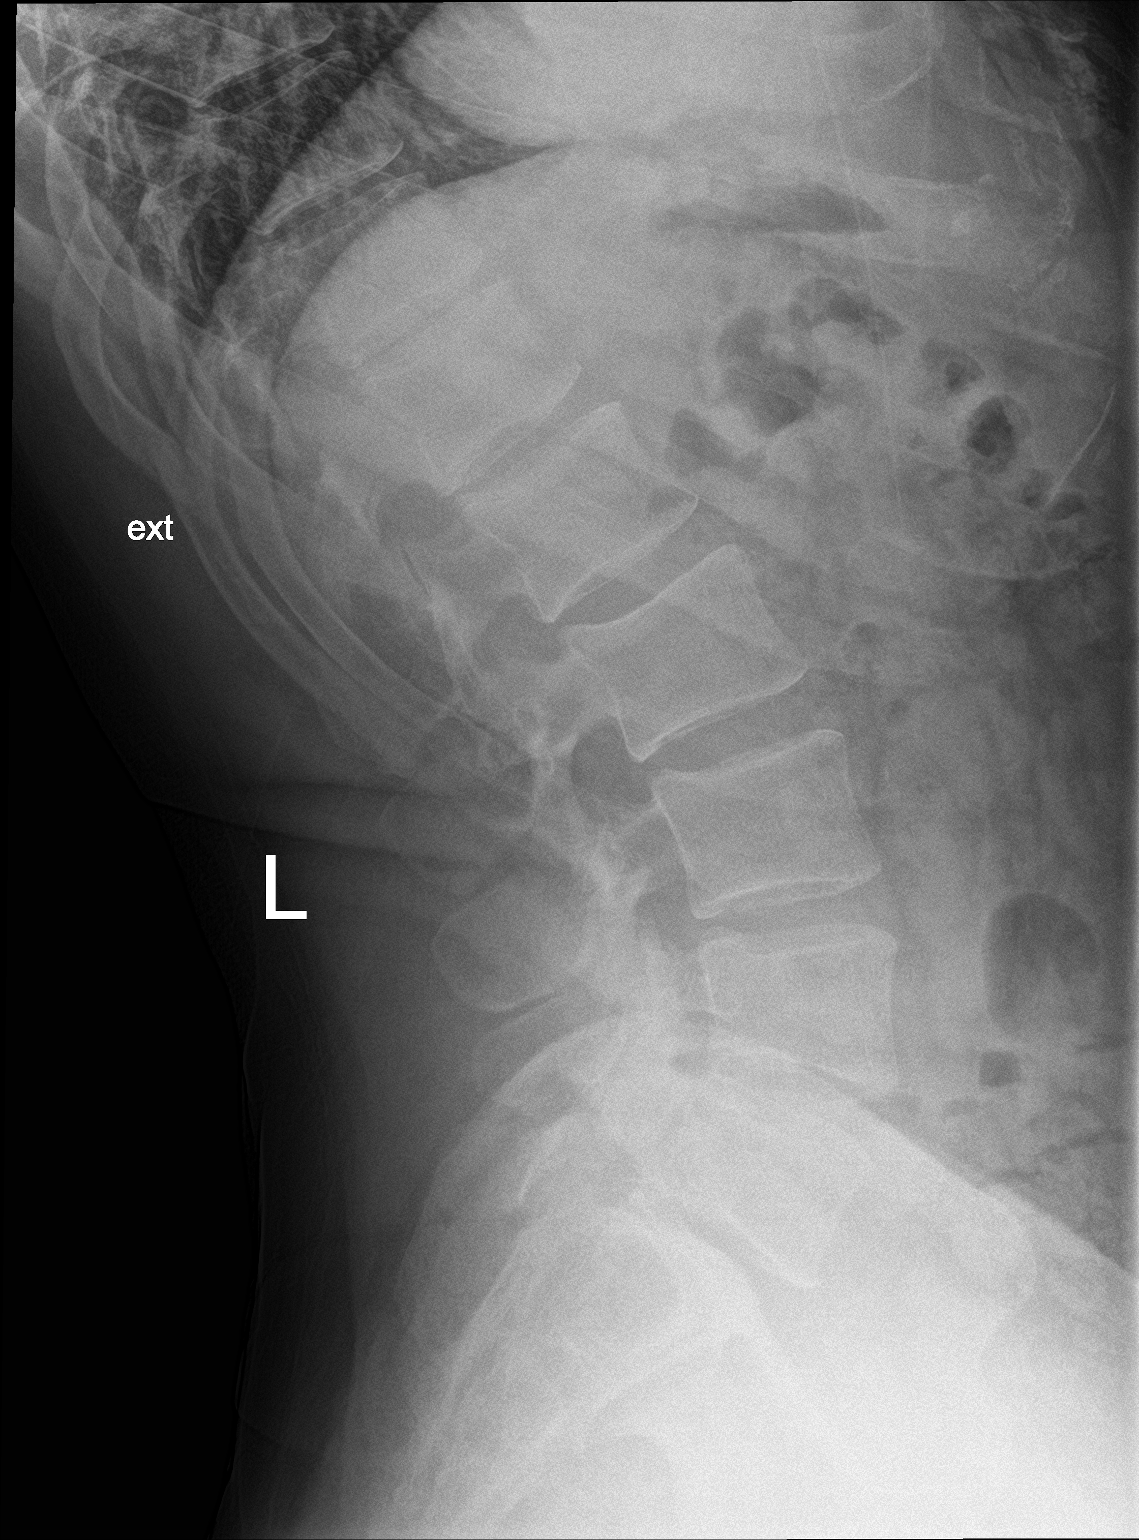

[3 of 3 positions shown; findings below may reference images not displayed]

FINDINGS: Vertebral body alignment and heights are normal. No evidence of
compression fracture. No evidence of significant spondylolisthesis.
Disc space narrowing at the L5-S1 level. Moderate facet arthropathy
over the mid to lower lumbar spine. Minimal spondylosis throughout
the lumbar spine. No evidence of instability on flexion and
extension.
IMPRESSION: 1. No acute findings.
2. Minimal spondylosis of the lumbar spine with disc disease at the
L5-S1 level. No instability on flexion and extension.

## 2023-04-01 ENCOUNTER — Ambulatory Visit: Admitting: Sports Medicine

## 2023-04-01 DIAGNOSIS — N529 Male erectile dysfunction, unspecified: Secondary | ICD-10-CM | POA: Diagnosis not present

## 2023-04-01 MED ORDER — TADALAFIL 5 MG PO TABS: 5.0000 mg | ORAL_TABLET | Freq: Every day | ORAL | 3 refills | Status: DC | PRN

## 2023-04-01 NOTE — Assessment & Plan Note (Addendum)
Robert [name] is a very pleasant 42 year old male, he has noted some difficulty with maintaining quality directions, he will typically get an erection, but it will not be able to be maintained to complete relations. Sex drive is adequate. Gets occasional discomfort scrotal sac as well. I discussed the pathophysiology of erectile dysfunction, he understands how, and it is now. We will start with low-dose Cialis, he will let me know how things go in a month.

## 2023-04-01 NOTE — Progress Notes (Signed)
    Procedures performed today:    None.  Independent interpretation of notes and tests performed by another provider:   None.  Brief History, Exam, Impression, and Recommendations:    Erectile dysfunction Robert [name] is a very pleasant 42 year old male, he has noted some difficulty with maintaining quality directions, he will typically get an erection, but it will not be able to be maintained to complete relations. Sex drive is adequate. Gets occasional discomfort scrotal sac as well. I discussed the pathophysiology of erectile dysfunction, he understands how, and it is now. We will start with low-dose Cialis, he will let me know how things go in a month.    ____________________________________________ Ihor Austin. Benjamin Stain, M.D., ABFM., CAQSM., AME. Primary Care and Sports Medicine [facility]  Adjunct Professor of Family Medicine  [employer] [facility] [employer]  Restaurant manager, fast food

## 2023-04-08 ENCOUNTER — Encounter: Payer: Self-pay | Admitting: Sports Medicine

## 2023-04-08 NOTE — Telephone Encounter (Signed)
Spoke with patient pharmacy. Patient's insurance company would only cover #6 tablets per month .  Patient can still pick up the remaining #84  but will either need to pay out of pocket, use good rx card or coupon card.  Patient informed and will look into good rx'

## 2023-04-09 ENCOUNTER — Telehealth: Payer: Self-pay | Admitting: Sports Medicine

## 2023-04-09 DIAGNOSIS — N529 Male erectile dysfunction, unspecified: Secondary | ICD-10-CM

## 2023-04-09 NOTE — Telephone Encounter (Signed)
Patient is needed a refill [facility] for tadalafil 5mg . Please Advise   [facility]

## 2023-04-10 MED ORDER — TADALAFIL 5 MG PO TABS: 5.0000 mg | ORAL_TABLET | Freq: Every day | ORAL | 3 refills | Status: AC | PRN

## 2023-04-10 NOTE — Telephone Encounter (Signed)
I will send it to [facility], but any additional refills need to be from a primary care provider.  Please let him know that he now has time to establish either with Dr. Tamera Punt or somewhere in [facility] or Dr. Ashley Royalty if he wants to take him.

## 2023-04-10 NOTE — Telephone Encounter (Signed)
Please send refill requests for non-ortho medications to PCP.

## 2023-04-10 NOTE — Telephone Encounter (Signed)
I am happy to see him, he'll just need to be scheduled as a new patient.    CM

## 2023-04-29 ENCOUNTER — Ambulatory Visit: Admitting: Sports Medicine

## 2024-03-10 ENCOUNTER — Ambulatory Visit: Admission: EM | Admit: 2024-03-10 | Discharge: 2024-03-10 | Disposition: A | Discharge status: Home / Self Care

## 2024-03-10 DIAGNOSIS — H00014 Hordeolum externum left upper eyelid: Secondary | ICD-10-CM | POA: Diagnosis not present

## 2024-03-10 MED ORDER — DOXYCYCLINE HYCLATE 100 MG PO CAPS: 100.0000 mg | ORAL_CAPSULE | Freq: Two times a day (BID) | ORAL | 0 refills | Status: AC

## 2024-03-10 NOTE — ED Provider Notes (Signed)
 Robert [name]    CSN: [id number] Arrival date & time: 03/10/24  1610      History   Chief Complaint Chief Complaint  Patient presents with   Facial Swelling    HPI Robert [name] is a 43 y.o. male.   HPI Robert [name] 43 year old male presents with left upper eye lid swelling since last night.  PMH significant for HLD, daily cigarette smoker, and T2DM without complications.  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 No pertinent surgical history.     Home Medications    Prior to Admission medications   Medication Sig Start Date End Date Taking? Authorizing Provider  atorvastatin (LIPITOR) 40 MG tablet Take 40 mg by mouth. 03/05/24  Yes [provider]  doxycycline  (VIBRAMYCIN ) 100 MG capsule Take 1 capsule (100 mg total) by mouth 2 (two) times daily for 7 days. 03/10/24 03/17/24 Yes Teddy Sharper, FNP  empagliflozin (JARDIANCE) 25 MG TABS tablet Take 25 mg by mouth. 11/26/23  Yes [provider]  tadalafil  (CIALIS ) 5 MG tablet Take 1 tablet (5 mg total) by mouth daily as needed for erectile dysfunction. 04/10/23   Curtis Debby PARAS, MD    Family History Family History  Problem Relation Age of Onset   Cancer Father        prostate   Healthy Mother     Social History Social History   Tobacco Use   Smoking status: Some Days    Current packs/day: 0.50    Average packs/day: 0.5 packs/day for  10.0 years (5.0 ttl pk-yrs)    Types: Cigarettes   Smokeless tobacco: Never   Tobacco comments:    using chantix   Vaping Use   Vaping status: Never Used  Substance Use Topics   Alcohol use: Yes    Alcohol/week: 2.0 standard drinks of alcohol    Types: 2 Standard drinks or equivalent per week   Drug use: No     Allergies   Patient has no known allergies.   Review of Systems Review of Systems   Physical Exam Triage Vital Signs ED Triage Vitals  Encounter Vitals Group     BP      Girls Systolic BP Percentile      Girls Diastolic BP Percentile      Boys Systolic BP Percentile      Boys Diastolic BP Percentile      Pulse      Resp      Temp      Temp src      SpO2      Weight      Height      Head Circumference      Peak Flow      Pain Score      Pain Loc      Pain Education      Exclude from Growth Chart    No data found.  Updated  Vital Signs BP 126/89   Pulse (!) 19   Temp 98.2 F (36.8 C)   Resp (!) 98   SpO2 98%   Visual Acuity Right Eye Distance:   Left Eye Distance:   Bilateral Distance:    Right Eye Near:   Left Eye Near:    Bilateral Near:     Physical Exam Vitals and nursing note reviewed.  Constitutional:      Appearance: Normal appearance. He is normal weight.  HENT:     Head: Normocephalic and atraumatic.     Mouth/Throat:     Mouth: Mucous membranes are moist.     Pharynx: Oropharynx is clear.  Eyes:     Extraocular Movements: Extraocular movements intact.     Conjunctiva/sclera: Conjunctivae normal.     Pupils: Pupils are equal, round, and reactive to light.     Comments: Left upper eyelid (lateral aspect): Acute plugging of meibomian gland, erythematous, indurated with inflammation  Cardiovascular:     Rate and Rhythm: Normal rate and regular rhythm.     Pulses: Normal pulses.     Heart sounds: Normal heart sounds.  Pulmonary:     Effort: Pulmonary effort is normal.     Breath sounds: Normal breath sounds. No wheezing,  rhonchi or rales.  Musculoskeletal:        General: Normal range of motion.     Cervical back: Normal range of motion and neck supple.  Skin:    General: Skin is warm and dry.  Neurological:     General: No focal deficit present.     Mental Status: He is alert and oriented to person, place, and time.  Psychiatric:        Mood and Affect: Mood normal.        Behavior: Behavior normal.      UC Treatments / Results  Labs (all labs ordered are listed, but only abnormal results are displayed) Labs Reviewed - No data to display  EKG   Radiology No results found.  Procedures Procedures (including critical [name] time)  Medications Ordered in UC Medications - No data to display  Initial Impression / Assessment and Plan / UC Course  I have reviewed the triage vital signs and the nursing notes.  Pertinent labs & imaging results that were available during my [name] of the patient were reviewed by me and considered in my medical decision making (see chart for details).     MDM: 1. Hordeolum externum of left upper eyelid-Rx'd doxycycline  100 mg capsule: Take 1 capsule twice daily x 7 days. Advised patient to take medication as directed with food to completion.  Encouraged to increase daily water intake to 64 ounces per day while taking his medication.  Advised if symptoms worsen and/or unresolved please follow-up with your PCP or here for further evaluation.  Patient discharged home, hemodynamically stable. Final Clinical Impressions(s) / UC Diagnoses   Final diagnoses:  Hordeolum externum of left upper eyelid     Discharge Instructions      Advised patient to take medication as directed with food to completion.  Encouraged to increase daily water intake to 64 ounces per day while taking his medication.  Advised if symptoms worsen and/or unresolved please follow-up with your PCP or here for further evaluation.     ED Prescriptions     Medication Sig Dispense Auth. Provider    doxycycline  (VIBRAMYCIN ) 100 MG capsule Take 1 capsule (100 mg total) by mouth 2 (two) times daily for 7 days.  14 capsule Torianne Laflam, FNP      PDMP not reviewed this encounter.   Teddy Sharper, FNP 03/10/24 1654

## 2024-03-10 NOTE — ED Triage Notes (Signed)
 Pt presents to uc with co of left top eyelid swelling since last night.

## 2024-03-10 NOTE — Discharge Instructions (Addendum)
 Advised patient to take medication as directed with food to completion.  Encouraged to increase daily water intake to 64 ounces per day while taking his medication.  Advised if symptoms worsen and/or unresolved please follow-up with your PCP or here for further evaluation.

## 2024-03-27 LAB — LAB REPORT - SCANNED
A1c: 8
PSA, Total: 0.49

## 2024-04-12 ENCOUNTER — Other Ambulatory Visit: Payer: Self-pay | Admitting: Sports Medicine

## 2024-04-12 DIAGNOSIS — N529 Male erectile dysfunction, unspecified: Secondary | ICD-10-CM

## 2024-05-11 ENCOUNTER — Encounter: Payer: Self-pay | Admitting: Sports Medicine

## 2024-06-17 LAB — LAB REPORT - SCANNED
Creatinine, POC: 44.2 mg/dL
Microalb Creat Ratio: 30
Microalbumin, Urine: <0.3

## 2024-07-01 ENCOUNTER — Ambulatory Visit: Admitting: Family Medicine

## 2024-07-02 ENCOUNTER — Ambulatory Visit

## 2024-07-02 ENCOUNTER — Encounter: Payer: Self-pay | Admitting: Family Medicine

## 2024-07-02 ENCOUNTER — Ambulatory Visit: Admitting: Family Medicine

## 2024-07-02 VITALS — BP 133/63 | HR 63 | Ht 77.0 in | Wt 231.0 lb

## 2024-07-02 DIAGNOSIS — E119 Type 2 diabetes mellitus without complications: Secondary | ICD-10-CM | POA: Diagnosis not present

## 2024-07-02 DIAGNOSIS — M25511 Pain in right shoulder: Secondary | ICD-10-CM | POA: Insufficient documentation

## 2024-07-02 DIAGNOSIS — M25512 Pain in left shoulder: Secondary | ICD-10-CM | POA: Diagnosis not present

## 2024-07-02 MED ORDER — MELOXICAM 15 MG PO TABS: ORAL_TABLET | ORAL | 1 refills | Status: DC

## 2024-07-02 NOTE — Assessment & Plan Note (Signed)
 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  Given handout and will refer to PT.  Xrays ordered.  Adding meloxicam .

## 2024-07-02 NOTE — Assessment & Plan Note (Signed)
 This is being managed through the [facility].  Currently on jardiance but not well controlled.  Contemplating GLP-1 which I encouraged him to try.  [facility] and [facility] provider.

## 2024-07-02 NOTE — Progress Notes (Signed)
 Robert [name] - 43 y.o. male MRN [id number]  Date of birth: [birth year]  Subjective Chief Complaint  Patient presents with   Shoulder Pain    BILATERAL    HPI Robert [name] is a 43 y.o. male here today with complaint of bilateral shoulder pain.  Pain started a few weeks ago.  No known injury.  Has pain with abduction mainly.  Pretty good range of motion with flexion and extension.  Denies numbness and/or tingling.    He is seeing a wellness clinic  as well for testosterone. Reports that he feels great with this.  A1c elevated at 8.0%.  He is seeing [facility] for management of his diabetes.  He is currently on Jardiance for management of his diabetes.  GLP1 discussed with his wellness clinic provider and he is leaning towards trying this.    ROS:  A comprehensive ROS was completed and negative except as noted per HPI  No Known Allergies  Past Medical History:  Diagnosis Date   Fatty liver 01/07/2017   Seen US  12/2016   Hyperlipidemia    Prediabetes 07/07/2017    History reviewed. No pertinent surgical history.  Social History   Socioeconomic History   Marital status: Married    Spouse name: Not on file   Number of children: Not on file   Years of education: Not on file   Highest education level: Bachelor's degree (e.g., BA, AB, BS)  Occupational History   Not on file  Tobacco Use   Smoking status: Some Days    Current packs/day: 0.50    Average packs/day: 0.5 packs/day for 10.0 years (5.0 ttl pk-yrs)    Types: Cigarettes   Smokeless tobacco: Never   Tobacco comments:    using chantix   Vaping Use   Vaping status: Never Used  Substance and Sexual Activity   Alcohol use: Yes    Alcohol/week: 2.0 standard drinks of alcohol    Types: 2 Standard drinks or equivalent per week   Drug use: No   Sexual activity: Not on file  Other Topics Concern   Not on file  Social History Narrative   Not on file   Social Drivers of Health   Financial Resource Strain: Low Risk   (04/01/2023)   Overall Financial Resource Strain (CARDIA)    Difficulty of Paying Living Expenses: Not hard at all  Food Insecurity: No Food Insecurity (04/01/2023)   Hunger Vital Sign    Worried About Running Out of Food in the Last Year: Never true    Ran Out of Food in the Last Year: Never true  Transportation Needs: No Transportation Needs (04/01/2023)   PRAPARE - Administrator, Civil Service (Medical): No    Lack of Transportation (Non-Medical): No  Physical Activity: Sufficiently Active (04/01/2023)   Exercise Vital Sign    Days of Exercise per Week: 3 days    Minutes of Exercise per Session: 50 min  Stress: No Stress Concern Present (04/01/2023)   [employer] of Occupational Health - Occupational Stress Questionnaire    Feeling of Stress : Only a little  Social Connections: Socially Isolated (04/01/2023)   Social Connection and Isolation Panel    Frequency of Communication with Friends and Family: Once a week    Frequency of Social Gatherings with Friends and Family: Once a week    Attends Religious Services: Never    Database administrator or Organizations: No    Attends Banker Meetings: Not on file  Marital Status: Married    Family History  Problem Relation Age of Onset   Cancer Father        prostate   Healthy Mother     Health Maintenance  Topic Date Due   FOOT EXAM  Never done   OPHTHALMOLOGY EXAM  Never done   Diabetic kidney evaluation - Urine ACR  Never done   Hepatitis C Screening  Never done   Pneumococcal Vaccine (1 of 2 - PCV) Never done   HPV VACCINES (1 - Risk 3-dose SCDM series) Never done   COVID-19 Vaccine (3 - Moderna risk series) 12/15/2019   HEMOGLOBIN A1C  06/17/2020   DTaP/Tdap/Td (2 - Td or Tdap) 09/09/2020   Diabetic kidney evaluation - eGFR measurement  12/16/2020   Colonoscopy  Never done   Influenza Vaccine  04/09/2024   Hepatitis B Vaccines 19-59 Average Risk  Completed   HIV Screening  Completed    Meningococcal B Vaccine  Aged Out     ----------------------------------------------------------------------------------------------------------------------------------------------------------------------------------------------------------------- Physical Exam BP 133/63 (BP Location: Left Arm, Patient Position: Sitting, Cuff Size: Normal)   Pulse 63   Ht 6' 5 (1.956 m)   Wt 231 lb (104.8 kg)   SpO2 99%   BMI 27.39 kg/m   Physical Exam Constitutional:      Appearance: Normal appearance.  Eyes:     General: No scleral icterus. Musculoskeletal:     Comments: Pain with abduction b/l Rotator cuff strength is normal but some pain with testing supraspinatus b/l No impingement symptoms.     Neurological:     General: No focal deficit present.     Mental Status: He is alert.  Psychiatric:        Mood and Affect: Mood normal.        Behavior: Behavior normal.     ------------------------------------------------------------------------------------------------------------------------------------------------------------------------------------------------------------------- Assessment and Plan  Type 2 diabetes mellitus without complications (HCC) This is being managed through the [facility].  Currently on jardiance but not well controlled.  Contemplating GLP-1 which I encouraged him to try.  [facility] and [facility] provider.    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  Given handout and will refer to PT.  Xrays ordered.  Adding meloxicam .    Meds ordered this encounter  Medications   meloxicam  (MOBIC ) 15 MG tablet    Sig: Take 1 tab po daily x2 weeks then 1 tab daily PRN.    Dispense:  30 tablet    Refill:  1    No follow-ups on file.

## 2024-07-02 NOTE — Patient Instructions (Signed)
 Calcium Buildup in Tendons (Calcific Tendinitis): What to Know  Calcific tendinitis is when small crystals of calcium called deposits build up in a tendon. Tendons are strong tissues that connect muscle to bone. They help your joints move. When calcium builds up in a tendon, the tendon may get: Stiff. Painful. Swollen. This often happens in the rotator cuff, which is a group of muscles and tendons in the shoulder. What are the causes? The cause isn't known. It may be linked to: Using a tendon too much, such as by doing the same movements over and over. Putting too much stress on the tendon. Getting older and wearing out the tendon. Having small injuries again and again. What increases the risk? You're more likely to have calcific tendinitis if: You do things where you repeat the same movements a lot. You're older. You're male. You have diabetes or thyroid problems. What are the signs or symptoms? Pain. It may hurt when you move the joint. But in some cases, it may not hurt at all. Tenderness. The tendon may hurt when you press on it. A snapping or popping sound when you move the joint. Less movement in the joint. Trouble sleeping because of pain in the joint. How is this diagnosed? You may be diagnosed with an exam. You may also have tests, such as: X-rays. MRI. CT scan. How is this treated? In most cases, calcific tendinitis gets better on its own. Treatment may include: RICE therapy. This stands for: Rest. Ice. Compression. This is when you put pressure on the area. Elevation. This means raising the area above the level of your heart. Putting heat on the area. Taking medicines to help with pain and swelling. Doing physical therapy. This can help you move better and get stronger. Doing an exercise program to keep the joint working well. In more serious cases, treatment may include: Shots of steroids or other medicines to help with pain. These are given into or around the  joint. Medicine to numb the area. You'll be told to move the joint after getting the medicine. Filling the joint with fluid to help the tendons move better. Shock wave therapy. This uses sound waves on the joint. If other treatments don't work, you may need surgery to clean out the deposits and fix the tendon. This is rare. Follow these instructions at home: Managing pain, stiffness, and swelling     Use ice or an ice pack as told. Place a towel between your skin and the ice. Leave the ice on for 20 minutes, 2-3 times a day. Use heat as told. Use the heat source that your health care provider recommends, such as a moist heat pack or a heating pad. Do this as often as told. Place a towel between your skin and the heat source. Leave the heat on for 20-30 minutes. If your skin turns red, take off the ice or heat right away to prevent skin damage. The risk of damage is higher if you can't feel pain, heat, or cold. Move your fingers or toes often to reduce stiffness and swelling. Raise the injured area above the level of your heart while you're sitting or lying down. Use pillows as needed. Medicines Take your medicines only as told. Ask your provider if it's safe to drive or use machines while taking your medicine. General instructions Do not smoke, vape, or use nicotine or tobacco. Doing this can slow down healing. Exercise and do activities as told. Ask your provider what things are safe  for you to do. Avoid using the affected area when you have symptoms of tendinitis. Wear an elastic bandage or compression wrap as told. Keep all follow-up visits. Your provider will check if the treatments are working. Contact a health care provider if: You have pain or numbness that gets worse. You have new weakness. You have more stiffness or feel looseness in the joint. You have more redness, swelling, or warmth around the joint area. You have a fever and your symptoms get worse all of a  sudden. This information is not intended to replace advice given to you by your health care provider. Make sure you discuss any questions you have with your health care provider. Document Revised: 10/30/2023 Document Reviewed: 10/30/2023 Elsevier Patient Education  2025 [employer].

## 2024-07-08 ENCOUNTER — Ambulatory Visit: Attending: Family Medicine | Admitting: Rehabilitative and Restorative Service Providers"

## 2024-07-08 ENCOUNTER — Other Ambulatory Visit: Payer: Self-pay

## 2024-07-08 ENCOUNTER — Encounter: Payer: Self-pay | Admitting: Rehabilitative and Restorative Service Providers"

## 2024-07-08 DIAGNOSIS — M25512 Pain in left shoulder: Secondary | ICD-10-CM | POA: Diagnosis present

## 2024-07-08 DIAGNOSIS — M25511 Pain in right shoulder: Secondary | ICD-10-CM | POA: Diagnosis present

## 2024-07-08 NOTE — Therapy (Signed)
 OUTPATIENT PHYSICAL THERAPY UPPER EXTREMITY EVALUATION   Patient Name: Robert [name] MRN: [id number] DOB:1981/04/17, 43 y.o., male Today's Date: 07/08/2024  END OF SESSION:  PT End of Session - 07/08/24 0808     Visit Number 1    Number of Visits 16    Date for Recertification  09/06/24    Authorization Type [employer]    PT Start Time 0805    PT Stop Time 0845    PT Time Calculation (min) 40 min    Activity Tolerance Patient tolerated treatment well    Behavior During Therapy [facility] for tasks assessed/performed          Past Medical History:  Diagnosis Date   Fatty liver 01/07/2017   Seen US  12/2016   Hyperlipidemia    Prediabetes 07/07/2017   History reviewed. No pertinent surgical history. 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   Smoking 01/27/2018   Type 2 diabetes mellitus without complications (HCC) 07/07/2017   Fatty liver 01/07/2017   Skin tags, multiple acquired 08/20/2016   Multiple nevi 04/09/2016   Back pain 02/06/2016   Left varicocele 02/06/2016    PCP: Velma Ku, DO REFERRING PROVIDER: Velma Ku HAS REFERRING DIAG: [phone] (ICD-10-CM) - Acute pain of both shoulders   THERAPY DIAG:  Acute pain of right shoulder  Acute pain of left shoulder  Rationale for Evaluation and Treatment: Rehabilitation  ONSET DATE: 07/02/24  SUBJECTIVE:                                                                                                                                                                                      SUBJECTIVE STATEMENT: The patient reports onset of pain 2 -3 months ago beginning in the L shoulder, which he sleeps on. He now has pain bilaterally, which did improve with recent use of meloxicam .  Hand dominance:  Right  PERTINENT HISTORY: Diabetes mellitus, back pain  PAIN:  Are you having pain? Yes: NPRS scale: 5/10 Pain location: bilateral shoulders in posterior/superior aspect Pain description: arc of pain around 90 degrees with limited motion Aggravating factors: movement, overhead movement Relieving factors: rest  PRECAUTIONS: None  WEIGHT BEARING RESTRICTIONS: No  FALLS:  Has patient fallen in last 6 months? 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  DIAGNOSTIC FINDINGS:  Xrays reviews in epic  PATIENT SURVEYS :  Quick Dash=6.8%  COGNITION: Overall cognitive status: Within functional limits for tasks assessed     SENSATION: WFL  POSTURE: WFLs  UPPER EXTREMITY ROM:  Active ROM Right eval Left eval  Shoulder flexion 148 158  Shoulder extension    Shoulder abduction 120 gets pianful arc, but can go to 152 110 gets pain, but can go to 150 deg  Shoulder adduction    Shoulder internal rotation    Shoulder external rotation 75 82  Elbow flexion    Elbow extension    Wrist flexion    Wrist extension    Wrist ulnar deviation    Wrist radial deviation    Wrist pronation    Wrist supination    (Blank rows = not tested)  UPPER EXTREMITY MMT: MMT Right eval Left eval  Shoulder flexion 5/5 5/5  Shoulder extension    Shoulder abduction 3+/5 with pain 4/5  Shoulder adduction    Shoulder internal rotation At neutral 5/5 At neutral 5/5  Shoulder external rotation At neutral 5/5 At neutral 5/5  Middle trapezius    Lower trapezius    Elbow flexion    Elbow extension    Wrist flexion    Wrist extension    Wrist ulnar deviation    Wrist radial deviation    Wrist pronation     Wrist supination    Grip strength (lbs)    (Blank rows = not tested)  SHOULDER SPECIAL TESTS: Impingement tests: Painful arc test: positive  otator cuff assessment: Empty can test: positive  *most provoking movements Biceps assessment: Yergason's test: negative and Speed's test: negative  JOINT MOBILITY TESTING:  Hypomobility and crepitus with movement  PALPATION:  Tender over infraspinatus bilaterally                                                                                                                            [facility] Adult PT Treatment:                                                DATE: 07/08/24 Therapeutic Exercise: Serratus punches 3# without difficulty Sidelying ER 3# and 5#-- 5# fatigues quickly Sidelying abduction and horizontal abduction Supine AROM flexion  PATIENT EDUCATION: Education details: HEP, plan of care Person educated: Patient Education method: Explanation, Demonstration, and Handouts Education comprehension: verbalized understanding, returned demonstration, and needs further education  HOME EXERCISE PROGRAM: Access Code: [email or web address] URL: [email or web address] Date: 07/08/2024 Prepared by: Tawni Ferrier  Program Notes Can also use a tennis ball/massage ball against the wall and lean on it with shoulder blade to gently massage.   Exercises - Sidelying Shoulder ER with Towel and Dumbbell  - 1 x daily - 5 x  weekly - 2 sets - 12 reps - Sidelying Shoulder Abduction Palm Forward  - 1 x daily - 5 x weekly - 2 sets - 10 reps - Sidelying Shoulder Horizontal Abduction  - 1 x daily - 5 x weekly - 2 sets - 12 reps - Supine Shoulder Flexion Extension Full Range AROM  - 1 x daily - 5 x weekly - 1 sets - 10 reps  ASSESSMENT:  CLINICAL IMPRESSION: Patient is a 43 y.o. male who was seen today for physical therapy evaluation and treatment for bilateral shoulder pain that began insidiously approximately 2 months ago. He presents with painful  arc ROM bilaterally, muscle weakness in bilat shoulders, mild decrease in AROM, tenderness to palpation bilat infraspinatus, and decreased ability to participate in wellness routine. PT to address deficits in order to reduce pain and return to prior functional status.   OBJECTIVE IMPAIRMENTS: decreased activity tolerance, decreased ROM, decreased strength, hypomobility, impaired flexibility, postural dysfunction, and pain.   ACTIVITY LIMITATIONS: lifting and reach over head  PARTICIPATION LIMITATIONS: workout routine  PERSONAL FACTORS: 1-2 comorbidities: diabetes are also affecting patient's functional outcome.   REHAB POTENTIAL: Good  CLINICAL DECISION MAKING: Evolving/moderate complexity  EVALUATION COMPLEXITY: Moderate  GOALS: Goals reviewed with patient? Yes  SHORT TERM GOALS: Target date: 08/07/24  The patient will be indep with initial HEP Baseline: initiated at eval Goal status: INITIAL  2.  The patient will demonstrate AROM in shoulders for abduction without painful arc between 110-120 degrees.  Baseline:  painful arc Goal status: INITIAL  3.  The patient will demonstrate overhead lifting 10# weights without pain to return to home routine. Baseline:  notes inability to lift overhead during workout routine Goal status: INITIAL  LONG TERM GOALS: Target date: 09/06/24  The patient will be indep with progression of HEP. Baseline:  initiated at eval Goal status: INITIAL  2.  The patient will improve Annitta to 0%  to demonstrate improved functional abilities. Baseline: 6% Goal status: INITIAL  3.  The patient will improve R shoulder abduction strength to 5/5 without pain for improved return to wellness routine. Baseline:  painful MMT R UE Goal status: INITIAL  4.  The patient will return to full workout routine without c/o pain. Baseline:  Modifying due to shoulder pain. Goal status: INITIAL  5.  The patient will report no pain in shoulders.  Baseline:  5/10 Goal status: INITIAL  PLAN: PT FREQUENCY: 1-2x/week  PT DURATION: 8 weeks  PLANNED INTERVENTIONS: 02835- PT Re-evaluation, 97750- Physical Performance Testing, 97110-Therapeutic exercises, 97530- Therapeutic activity, [id number]- Neuromuscular re-education, 97535- Self Care, 02859- Manual therapy, 20560 (1-2 muscles), 20561 (3+ muscles)- Dry Needling, Patient/Family education, Taping, and Joint mobilization  PLAN FOR NEXT SESSION: work on stretching upper back (had rhomboid pain prior to onset of shoulder pain), upper back strengthening, RTC strengthening. Eventually working on return to gym routine.   Melita Villalona, PT 07/08/2024, 2:33 PM

## 2024-07-14 NOTE — Therapy (Signed)
 OUTPATIENT PHYSICAL THERAPY TREATMENT   Patient Name: Robert [name] MRN: [id number] DOB:1980-11-23, 43 y.o., male Today's Date: 07/15/2024  END OF SESSION:  PT End of Session - 07/15/24 1402     Visit Number 2    Number of Visits 16    Date for Recertification  09/06/24    Authorization Type [employer]    PT Start Time 1402    PT Stop Time 1442    PT Time Calculation (min) 40 min           Past Medical History:  Diagnosis Date   Fatty liver 01/07/2017   Seen US  12/2016   Hyperlipidemia    Prediabetes 07/07/2017   History reviewed. No pertinent surgical history. 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   Smoking 01/27/2018   Type 2 diabetes mellitus without complications (HCC) 07/07/2017   Fatty liver 01/07/2017   Skin tags, multiple acquired 08/20/2016   Multiple nevi 04/09/2016   Back pain 02/06/2016   Left varicocele 02/06/2016    PCP: Velma Ku, DO REFERRING PROVIDER: Velma Ku HAS REFERRING DIAG: [phone] (ICD-10-CM) - Acute pain of both shoulders   THERAPY DIAG:  Acute pain of right shoulder  Acute pain of left shoulder  Rationale for Evaluation and Treatment: Rehabilitation  ONSET DATE: 07/02/24  SUBJECTIVE:                                                                                                                                                                                     Per eval: The patient reports onset of pain 2 -3 months ago beginning in the L shoulder, which he sleeps on. He now has pain bilaterally, which did improve with recent use of meloxicam .  Hand dominance: Right  SUBJECTIVE STATEMENT: 07/15/2024: states he is feeling pretty good. Notes meloxicam  has been really helpful, not having constant pain  anymore. Felt a bit irritated after eval. Feels mobility improving. HEP not aggravating but does fatigue quickly.   PERTINENT HISTORY: Diabetes mellitus, back pain  PAIN:  Are you having pain? 07/15/2024 none at present   Per eval: Yes: NPRS scale: 5/10 Pain location: bilateral shoulders in posterior/superior aspect Pain description: arc of pain around 90 degrees with limited motion Aggravating factors: movement, overhead movement Relieving factors: rest  PRECAUTIONS: None  WEIGHT BEARING RESTRICTIONS: No  FALLS:  Has patient fallen in last 6 months? 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  DIAGNOSTIC FINDINGS:  Xrays reviews in epic  PATIENT SURVEYS :  Quick Dash=6.8%  COGNITION: Overall cognitive status: Within functional limits for tasks assessed     SENSATION: WFL  POSTURE: WFLs  UPPER EXTREMITY ROM:  Active ROM Right eval Left eval  Shoulder flexion 148 158  Shoulder extension    Shoulder abduction 120 gets pianful arc, but can go to 152 110 gets pain, but can go to 150 deg  Shoulder adduction    Shoulder internal rotation    Shoulder external rotation 75 82  Elbow flexion    Elbow extension    Wrist flexion    Wrist extension    Wrist ulnar deviation    Wrist radial deviation    Wrist pronation    Wrist supination    (Blank rows = not tested)  UPPER EXTREMITY MMT: MMT Right eval Left eval  Shoulder flexion 5/5 5/5  Shoulder extension    Shoulder abduction 3+/5 with pain 4/5  Shoulder adduction    Shoulder internal rotation At neutral 5/5 At neutral 5/5  Shoulder external rotation At neutral 5/5 At neutral 5/5  Middle trapezius    Lower trapezius     Elbow flexion    Elbow extension    Wrist flexion    Wrist extension    Wrist ulnar deviation    Wrist radial deviation    Wrist pronation    Wrist supination    Grip strength (lbs)    (Blank rows = not tested)  SHOULDER SPECIAL TESTS: Impingement tests: Painful arc test: positive  otator cuff assessment: Empty can test: positive  *most provoking movements Biceps assessment: Yergason's test: negative and Speed's test: negative  JOINT MOBILITY TESTING:  Hypomobility and crepitus with movement  PALPATION:  Tender over infraspinatus bilaterally                                                                                                                            [facility] Adult PT Treatment:                                                DATE: 07/15/24 Therapeutic Exercise: Supine shoulder flexion AROM x12  Sidelying 3# ER 3-3-3 tempo x8 BIL  Sidelying unweighted shoulder flexion 3-3-3 tempo x8 BIL Sidelying horizontal abd 3# x8 3-3-3 tempo BIL  Standing T spine ext w/ foam roll at wall x10 Seated T spine ext w/ foam roll at wall x10 (improved low back tolerance) HEP education/discussion   Neuromuscular re-ed: Serratus push up wall 2x12  Seated YB scaption <90 deg 2x8 cues for posture and control  [facility] Adult PT Treatment:                                                DATE: 07/08/24 Therapeutic Exercise: Serratus punches 3# without difficulty Sidelying ER 3# and 5#-- 5# fatigues quickly Sidelying abduction and horizontal abduction Supine AROM flexion  PATIENT EDUCATION: Education details: HEP, plan of care Person educated: Patient Education method: Explanation, Demonstration, and Handouts Education comprehension: verbalized understanding, returned demonstration, and needs further education  HOME EXERCISE PROGRAM: Access Code: [email or web address] URL: [email or web address] Date: 07/08/2024 Prepared by: Tawni Ferrier  Program Notes Can also use a tennis  ball/massage ball against the wall and lean on it with shoulder blade to gently massage.   Exercises - Sidelying Shoulder ER with Towel and Dumbbell  - 1 x daily - 5 x weekly - 2 sets - 12 reps - Sidelying Shoulder Abduction Palm Forward  - 1 x daily - 5 x weekly - 2 sets - 10 reps - Sidelying Shoulder Horizontal Abduction  - 1 x daily - 5 x weekly - 2 sets - 12 reps - Supine Shoulder Flexion Extension Full Range AROM  - 1 x daily - 5 x weekly - 1 sets - 10 reps  ASSESSMENT:  CLINICAL IMPRESSION: 07/15/2024: Pt arrives w/o resting pain - reports good HEP adherence/tolerance, some irritability after initial eval but overall symptoms much improved w/ meloxicam . Today focusing on HEP review which he does well with, able to increase resistance for horizontal abduction. Also working on thoracic mobility which he describes as relieving with shoulder flexion retest. Also introducing serratus and RC work towards overhead movements for improved motor control/stability. No adverse events, tolerates session well overall with muscular fatigue as expected but no increase in pain. Recommend continuing along current POC in order to address relevant deficits and improve functional tolerance. Pt departs today's session in no acute distress, all voiced questions/concerns addressed appropriately from PT perspective.    Per eval: Patient is a 43 y.o. male who was seen today for physical therapy evaluation and treatment for bilateral shoulder pain that began insidiously approximately 2 months ago. He presents with painful arc ROM bilaterally, muscle weakness in bilat shoulders, mild decrease in AROM, tenderness to palpation bilat infraspinatus, and decreased ability to participate in wellness routine. PT to address deficits in order to reduce pain and return to prior functional status.   OBJECTIVE IMPAIRMENTS: decreased activity tolerance, decreased ROM, decreased strength, hypomobility, impaired flexibility, postural  dysfunction, and pain.   ACTIVITY LIMITATIONS: lifting and reach over head  PARTICIPATION LIMITATIONS: workout routine  PERSONAL FACTORS: 1-2 comorbidities: diabetes are also affecting patient's functional outcome.   REHAB POTENTIAL: Good  CLINICAL DECISION MAKING: Evolving/moderate complexity  EVALUATION COMPLEXITY: Moderate  GOALS: Goals reviewed with patient? Yes  SHORT TERM GOALS: Target date: 08/07/24  The patient will be indep with initial HEP Baseline: initiated at eval Goal status: INITIAL  2.  The patient will demonstrate AROM in shoulders for abduction without painful arc between 110-120 degrees.  Baseline:  painful arc Goal status: INITIAL  3.  The patient will demonstrate overhead lifting 10# weights without pain to return to home routine. Baseline:  notes inability to lift overhead during workout routine Goal status: INITIAL  LONG TERM GOALS: Target date: 09/06/24  The patient will be indep with progression of HEP. Baseline:  initiated at eval Goal status:  INITIAL  2.  The patient will improve Annitta to 0%  to demonstrate improved functional abilities. Baseline: 6% Goal status: INITIAL  3.  The patient will improve R shoulder abduction strength to 5/5 without pain for improved return to wellness routine. Baseline:  painful MMT R UE Goal status: INITIAL  4.  The patient will return to full workout routine without c/o pain. Baseline:  Modifying due to shoulder pain. Goal status: INITIAL  5.  The patient will report no pain in shoulders.  Baseline: 5/10 Goal status: INITIAL  PLAN: PT FREQUENCY: 1-2x/week  PT DURATION: 8 weeks  PLANNED INTERVENTIONS: 02835- PT Re-evaluation, 97750- Physical Performance Testing, 97110-Therapeutic exercises, 97530- Therapeutic activity, [id number]- Neuromuscular re-education, 97535- Self Care, 02859- Manual therapy, 20560 (1-2 muscles), 20561 (3+ muscles)- Dry Needling, Patient/Family education, Taping, and Joint  mobilization  PLAN FOR NEXT SESSION: work on stretching upper back (had rhomboid pain prior to onset of shoulder pain), upper back strengthening, RTC strengthening. Eventually working on return to gym routine.   Alm DELENA Jenny PT, DPT 07/15/2024 2:45 PM

## 2024-07-15 ENCOUNTER — Encounter: Payer: Self-pay | Admitting: Physical Therapy

## 2024-07-15 ENCOUNTER — Ambulatory Visit: Attending: Family Medicine | Admitting: Physical Therapy

## 2024-07-15 DIAGNOSIS — M25511 Pain in right shoulder: Secondary | ICD-10-CM | POA: Diagnosis present

## 2024-07-15 DIAGNOSIS — M25512 Pain in left shoulder: Secondary | ICD-10-CM | POA: Insufficient documentation

## 2024-07-16 ENCOUNTER — Ambulatory Visit: Payer: Self-pay | Admitting: Family Medicine

## 2024-07-22 ENCOUNTER — Encounter: Payer: Self-pay | Admitting: Physical Therapy

## 2024-07-22 ENCOUNTER — Ambulatory Visit: Admitting: Physical Therapy

## 2024-07-22 DIAGNOSIS — M25512 Pain in left shoulder: Secondary | ICD-10-CM

## 2024-07-22 DIAGNOSIS — M25511 Pain in right shoulder: Secondary | ICD-10-CM | POA: Diagnosis not present

## 2024-07-22 NOTE — Therapy (Signed)
 OUTPATIENT PHYSICAL THERAPY TREATMENT   Patient Name: Robert [name] MRN: [id number] DOB:03/03/1981, 43 y.o., male Today's Date: 07/22/2024  END OF SESSION:  PT End of Session - 07/22/24 1445     Visit Number 3    Number of Visits 16    Date for Recertification  09/06/24    Authorization Type [employer]    PT Start Time 1446    PT Stop Time 1527    PT Time Calculation (min) 41 min            Past Medical History:  Diagnosis Date   Fatty liver 01/07/2017   Seen US  12/2016   Hyperlipidemia    Prediabetes 07/07/2017   History reviewed. No pertinent surgical history. 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   Smoking 01/27/2018   Type 2 diabetes mellitus without complications (HCC) 07/07/2017   Fatty liver 01/07/2017   Skin tags, multiple acquired 08/20/2016   Multiple nevi 04/09/2016   Back pain 02/06/2016   Left varicocele 02/06/2016    PCP: Velma Ku, DO REFERRING PROVIDER: Velma Ku HAS REFERRING DIAG: [phone] (ICD-10-CM) - Acute pain of both shoulders   THERAPY DIAG:  Acute pain of right shoulder  Acute pain of left shoulder  Rationale for Evaluation and Treatment: Rehabilitation  ONSET DATE: 07/02/24  SUBJECTIVE:                                                                                                                                                                                     Per eval: The patient reports onset of pain 2 -3 months ago beginning in the L shoulder, which he sleeps on. He now has pain bilaterally, which did improve with recent use of meloxicam .  Hand dominance: Right  SUBJECTIVE STATEMENT: 07/22/2024: states he is feeling good, moving better without pain. States medication has been really helpful.  States exercises have been helpful.    PERTINENT HISTORY: Diabetes mellitus, back pain  PAIN:  Are you having pain? 07/22/2024 none at present   Per eval: Yes: NPRS scale: 5/10 Pain location: bilateral shoulders in posterior/superior aspect Pain description: arc of pain around 90 degrees with limited motion Aggravating factors: movement, overhead movement Relieving factors: rest  PRECAUTIONS: None  WEIGHT BEARING RESTRICTIONS: No  FALLS:  Has patient fallen in last 6 months? 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  DIAGNOSTIC FINDINGS:  Xrays reviews in epic  PATIENT SURVEYS :  Quick Dash=6.8%  COGNITION: Overall cognitive status: Within functional limits for tasks assessed     SENSATION: WFL  POSTURE: WFLs  UPPER EXTREMITY ROM:  Active ROM Right eval Left eval R/L 07/22/24  Shoulder flexion 148 158 152/ 155 painless  Shoulder extension     Shoulder abduction 120 gets pianful arc, but can go to 152 110 gets pain, but can go to 150 deg 160 / 145 deg painless  Shoulder adduction     Shoulder internal rotation     Shoulder external rotation 75 82   Elbow flexion     Elbow extension     Wrist flexion     Wrist extension     Wrist ulnar deviation     Wrist radial deviation     Wrist pronation     Wrist supination     (Blank rows = not tested)  UPPER EXTREMITY MMT: MMT Right eval Left eval  Shoulder flexion 5/5 5/5  Shoulder extension    Shoulder abduction 3+/5 with pain 4/5  Shoulder adduction    Shoulder internal rotation At neutral 5/5 At neutral 5/5  Shoulder external rotation At neutral 5/5 At neutral 5/5  Middle trapezius    Lower trapezius    Elbow  flexion    Elbow extension    Wrist flexion    Wrist extension    Wrist ulnar deviation    Wrist radial deviation    Wrist pronation    Wrist supination    Grip strength (lbs)    (Blank rows = not tested)  SHOULDER SPECIAL TESTS: Impingement tests: Painful arc test: positive  otator cuff assessment: Empty can test: positive  *most provoking movements Biceps assessment: Yergason's test: negative and Speed's test: negative  JOINT MOBILITY TESTING:  Hypomobility and crepitus with movement  PALPATION:  Tender over infraspinatus bilaterally                                                                                                                             [facility] Adult PT Treatment:                                                DATE: 07/22/24 Therapeutic Exercise: CC row 15# single arm; x8 BIL cues for form CC high>low row 15# single arm; x8 BIL cues for form  CC lawnmower row 15# single arm; x8 BIL cues for form  CC mid row (shoulder 90 deg abd) 10# x8 BIL cues for form  Cat/camel x8 cues for comfortable ROM  DB three way raise (flex/scap/abd) 3# BIL x8 total; cues for shoulder positioning HEP  update + education/discussion  Neuromuscular re-ed: Serratus foam roll push up wall x15 Serratus foam roll push + YB at wrists x10  YB ER fly; seated; 2x10  5# KB bottoms up carry 2 laps BIL     [facility] Adult PT Treatment:                                                DATE: 07/15/24 Therapeutic Exercise: Supine shoulder flexion AROM x12  Sidelying 3# ER 3-3-3 tempo x8 BIL  Sidelying unweighted shoulder flexion 3-3-3 tempo x8 BIL Sidelying horizontal abd 3# x8 3-3-3 tempo BIL  Standing T spine ext w/ foam roll at wall x10 Seated T spine ext w/ foam roll at wall x10 (improved low back tolerance) HEP education/discussion   Neuromuscular re-ed: Serratus push up wall 2x12  Seated YB scaption <90 deg 2x8 cues for posture and control     [facility] Adult PT Treatment:                                                 DATE: 07/08/24 Therapeutic Exercise: Serratus punches 3# without difficulty Sidelying ER 3# and 5#-- 5# fatigues quickly Sidelying abduction and horizontal abduction Supine AROM flexion  PATIENT EDUCATION: Education details: HEP, plan of care Person educated: Patient Education method: Explanation, Demonstration, and Handouts Education comprehension: verbalized understanding, returned demonstration, and needs further education  HOME EXERCISE PROGRAM: Access Code: [email or web address] URL: [email or web address] Date: 07/22/2024 Prepared by: Alm Jenny  Program Notes Can also use a tennis ball/massage ball against the wall and lean on it with shoulder blade to gently massage.   Exercises - Sidelying Shoulder ER with Towel and Dumbbell  - 1 x daily - 5 x weekly - 2 sets - 12 reps - Sidelying Shoulder Abduction Palm Forward  - 1 x daily - 5 x weekly - 2 sets - 10 reps - Sidelying Shoulder Horizontal Abduction  - 1 x daily - 5 x weekly - 2 sets - 12 reps - Standing Shoulder Row with Anchored Resistance  - 1 x daily - 5 x weekly - 2 sets - 10-12 reps - Standing Shoulder External Rotation with Resistance  - 1 x daily - 5 x weekly - 2 sets - 8 reps  ASSESSMENT:  CLINICAL IMPRESSION: 07/22/2024: Pt arrives w/o pain, continues to report steady improvement. No pain with basic shoulder mobility today. Able to tolerate progression into more end range strengthening/stability work without pain, some muscular fatigue as expected. No adverse events, cues as above. Recommend continuing along current POC in order to address relevant deficits and improve functional tolerance. Pt departs today's session in no acute distress, all voiced questions/concerns addressed appropriately from PT perspective.      Per eval: Patient is a 43 y.o. male who was seen today for physical therapy evaluation and treatment for bilateral shoulder pain that began insidiously approximately 2  months ago. He presents with painful arc ROM bilaterally, muscle weakness in bilat shoulders, mild decrease in AROM, tenderness to palpation bilat infraspinatus, and decreased ability to participate in wellness routine. PT to address deficits in order to reduce pain and return to prior functional status.   OBJECTIVE IMPAIRMENTS: decreased activity tolerance, decreased ROM, decreased strength, hypomobility, impaired  flexibility, postural dysfunction, and pain.   ACTIVITY LIMITATIONS: lifting and reach over head  PARTICIPATION LIMITATIONS: workout routine  PERSONAL FACTORS: 1-2 comorbidities: diabetes are also affecting patient's functional outcome.   REHAB POTENTIAL: Good  CLINICAL DECISION MAKING: Evolving/moderate complexity  EVALUATION COMPLEXITY: Moderate  GOALS: Goals reviewed with patient? Yes  SHORT TERM GOALS: Target date: 08/07/24  The patient will be indep with initial HEP Baseline: initiated at eval Goal status: INITIAL  2.  The patient will demonstrate AROM in shoulders for abduction without painful arc between 110-120 degrees.  Baseline:  painful arc Goal status: INITIAL  3.  The patient will demonstrate overhead lifting 10# weights without pain to return to home routine. Baseline:  notes inability to lift overhead during workout routine Goal status: INITIAL  LONG TERM GOALS: Target date: 09/06/24  The patient will be indep with progression of HEP. Baseline:  initiated at eval Goal status: INITIAL  2.  The patient will improve Annitta to 0%  to demonstrate improved functional abilities. Baseline: 6% Goal status: INITIAL  3.  The patient will improve R shoulder abduction strength to 5/5 without pain for improved return to wellness routine. Baseline:  painful MMT R UE Goal status: INITIAL  4.  The patient will return to full workout routine without c/o pain. Baseline:  Modifying due to shoulder pain. Goal status: INITIAL  5.  The patient will report no  pain in shoulders.  Baseline: 5/10 Goal status: INITIAL  PLAN: PT FREQUENCY: 1-2x/week  PT DURATION: 8 weeks  PLANNED INTERVENTIONS: 02835- PT Re-evaluation, 97750- Physical Performance Testing, 97110-Therapeutic exercises, 97530- Therapeutic activity, [id number]- Neuromuscular re-education, 97535- Self Care, 02859- Manual therapy, 20560 (1-2 muscles), 20561 (3+ muscles)- Dry Needling, Patient/Family education, Taping, and Joint mobilization  PLAN FOR NEXT SESSION: work on stretching upper back (had rhomboid pain prior to onset of shoulder pain), upper back strengthening, RTC strengthening. Eventually working on return to gym routine.   Alm DELENA Jenny PT, DPT 07/22/2024 3:30 PM

## 2024-07-23 ENCOUNTER — Ambulatory Visit: Admitting: Family Medicine

## 2024-07-27 ENCOUNTER — Encounter: Payer: Self-pay | Admitting: Physical Therapy

## 2024-07-27 ENCOUNTER — Ambulatory Visit: Admitting: Physical Therapy

## 2024-07-27 DIAGNOSIS — M25511 Pain in right shoulder: Secondary | ICD-10-CM

## 2024-07-27 DIAGNOSIS — M25512 Pain in left shoulder: Secondary | ICD-10-CM

## 2024-07-27 NOTE — Therapy (Signed)
 OUTPATIENT PHYSICAL THERAPY TREATMENT   Patient Name: Robert [name] MRN: [id number] DOB:08-21-1981, 43 y.o., male Today's Date: 07/27/2024  END OF SESSION:  PT End of Session - 07/27/24 1559     Visit Number 4    Number of Visits 16    Date for Recertification  09/06/24    Authorization Type [employer]    PT Start Time 1600    PT Stop Time 1645    PT Time Calculation (min) 45 min             Past Medical History:  Diagnosis Date   Fatty liver 01/07/2017   Seen US  12/2016   Hyperlipidemia    Prediabetes 07/07/2017   History reviewed. No pertinent surgical history. 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   Smoking 01/27/2018   Type 2 diabetes mellitus without complications (HCC) 07/07/2017   Fatty liver 01/07/2017   Skin tags, multiple acquired 08/20/2016   Multiple nevi 04/09/2016   Back pain 02/06/2016   Left varicocele 02/06/2016    PCP: Velma Ku, DO REFERRING PROVIDER: Velma Ku HAS REFERRING DIAG: [phone] (ICD-10-CM) - Acute pain of both shoulders   THERAPY DIAG:  Acute pain of right shoulder  Acute pain of left shoulder  Rationale for Evaluation and Treatment: Rehabilitation  ONSET DATE: 07/02/24  SUBJECTIVE:                                                                                                                                                                                     Per eval: The patient reports onset of pain 2 -3 months ago beginning in the L shoulder, which he sleeps on. He now has pain bilaterally, which did improve with recent use of meloxicam .  Hand dominance: Right  SUBJECTIVE STATEMENT: 07/27/2024: had to do drills/PT over the weekend - had to do some push up variations, felt the shoulders a  little bit but didn't linger. Had to do lifting and running as well, planking. Hasn't taken mobic  in past two days and still feeling pretty good. Follows with physician Thursday   PERTINENT HISTORY: Diabetes mellitus, back pain  PAIN:  Are you having pain? 07/27/2024 none at present, no more than 1-2/10 over past couple days   Per eval: Yes: NPRS scale: 5/10 Pain location: bilateral shoulders in posterior/superior aspect Pain description: arc of pain around 90 degrees with limited motion Aggravating factors: movement, overhead movement Relieving factors: rest  PRECAUTIONS: None  WEIGHT  BEARING RESTRICTIONS: No  FALLS:  Has patient fallen in last 6 months? 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  DIAGNOSTIC FINDINGS:  Xrays reviews in epic  PATIENT SURVEYS :  Quick Dash=6.8%  COGNITION: Overall cognitive status: Within functional limits for tasks assessed     SENSATION: WFL  POSTURE: WFLs  UPPER EXTREMITY ROM:  Active ROM Right eval Left eval R/L 07/22/24  Shoulder flexion 148 158 152/ 155 painless  Shoulder extension     Shoulder abduction 120 gets pianful arc, but can go to 152 110 gets pain, but can go to 150 deg 160 / 145 deg painless  Shoulder adduction     Shoulder internal rotation     Shoulder external rotation 75 82   Elbow flexion     Elbow extension     Wrist flexion     Wrist extension     Wrist ulnar deviation     Wrist radial deviation     Wrist pronation     Wrist supination     (Blank rows = not tested)  UPPER EXTREMITY MMT: MMT Right eval Left eval  Shoulder flexion 5/5 5/5  Shoulder extension    Shoulder abduction 3+/5 with pain 4/5  Shoulder  adduction    Shoulder internal rotation At neutral 5/5 At neutral 5/5  Shoulder external rotation At neutral 5/5 At neutral 5/5  Middle trapezius    Lower trapezius    Elbow flexion    Elbow extension    Wrist flexion    Wrist extension    Wrist ulnar deviation    Wrist radial deviation    Wrist pronation    Wrist supination    Grip strength (lbs)    (Blank rows = not tested)  SHOULDER SPECIAL TESTS: Impingement tests: Painful arc test: positive  otator cuff assessment: Empty can test: positive  *most provoking movements Biceps assessment: Yergason's test: negative and Speed's test: negative  JOINT MOBILITY TESTING:  Hypomobility and crepitus with movement  PALPATION:  Tender over infraspinatus bilaterally                                                                                                                             [facility] Adult PT Treatment:                                                DATE: 07/27/24 Therapeutic Exercise: DB three way raise 5# x8 BIL cues for comfortable ROM  CC 15# CC high>low row x10 BIL, 20# ER iso hold 5# CC; 2x30sec BIL in neutral position  HEP update + handout/education  Neuromuscular re-ed: 5# KB bottoms up carry 1 lap each UE, 10# 1 lap each  10# bottoms up KB OH press BIL  Serratus wall push w/ foam roller x15  Quadruped scapular protraction 2x10, cues for hand positioning  GB W 2x10    [facility] Adult PT Treatment:                                                DATE: 07/22/24 Therapeutic Exercise: CC row 15# single arm; x8 BIL cues for form CC high>low row 15# single arm; x8 BIL cues for form  CC lawnmower row 15# single arm; x8 BIL cues for form  CC mid row (shoulder 90 deg abd) 10# x8 BIL cues for form  Cat/camel x8 cues for comfortable ROM  DB three way raise (flex/scap/abd) 3# BIL x8 total; cues for shoulder positioning HEP update + education/discussion  Neuromuscular re-ed: Serratus foam roll push up wall x15 Serratus  foam roll push + YB at wrists x10  YB ER fly; seated; 2x10  5# KB bottoms up carry 2 laps BIL     [facility] Adult PT Treatment:                                                DATE: 07/15/24 Therapeutic Exercise: Supine shoulder flexion AROM x12  Sidelying 3# ER 3-3-3 tempo x8 BIL  Sidelying unweighted shoulder flexion 3-3-3 tempo x8 BIL Sidelying horizontal abd 3# x8 3-3-3 tempo BIL  Standing T spine ext w/ foam roll at wall x10 Seated T spine ext w/ foam roll at wall x10 (improved low back tolerance) HEP education/discussion   Neuromuscular re-ed: Serratus push up wall 2x12  Seated YB scaption <90 deg 2x8 cues for posture and control   PATIENT EDUCATION: Education details: HEP, plan of care Person educated: Patient Education method: Explanation, Demonstration, and Handouts Education comprehension: verbalized understanding, returned demonstration, and needs further education  HOME EXERCISE PROGRAM: Access Code: [email or web address] URL: [email or web address] Date: 07/27/2024 Prepared by: Alm Jenny  Exercises - Sidelying Shoulder ER with Towel and Dumbbell  - 1 x daily - 5 x weekly - 2 sets - 15 reps - Sidelying Shoulder Abduction Palm Forward  - 1 x daily - 5 x weekly - 2 sets - 15 reps - Sidelying Shoulder Horizontal Abduction  - 1 x daily - 5 x weekly - 2 sets - 15 reps - Standing Shoulder Row with Anchored Resistance  - 1 x daily - 5 x weekly - 2 sets - 10-12 reps - Half Kneel Row W  - 1 x daily - 5 x weekly - 2-3 sets - 6-8 reps - Serratus Activation at Wall with Foam Roll and Resistance Band  - 1 x daily - 5 x weekly - 2-3 sets - 6-8 reps  ASSESSMENT:  CLINICAL IMPRESSION: 07/27/2024: Pt arrives w/ report of continued progress, has not taken mobic  in past two days and still improved pain. Today his symptoms do well overall - does have some mild transient pain with loaded abduction that improves with cues for ROM <75 deg, also some transient RUE numbness with quadruped  scapular protraction that improves with repositioning in more external rotation, and full resolves w/  rest. Tolerates remainder of activity without symptoms, no adverse events. Recommend continuing along current POC in order to address relevant deficits and improve functional tolerance. Pt departs today's session in no acute distress, all voiced questions/concerns addressed appropriately from PT perspective.     Per eval: Patient is a 43 y.o. male who was seen today for physical therapy evaluation and treatment for bilateral shoulder pain that began insidiously approximately 2 months ago. He presents with painful arc ROM bilaterally, muscle weakness in bilat shoulders, mild decrease in AROM, tenderness to palpation bilat infraspinatus, and decreased ability to participate in wellness routine. PT to address deficits in order to reduce pain and return to prior functional status.   OBJECTIVE IMPAIRMENTS: decreased activity tolerance, decreased ROM, decreased strength, hypomobility, impaired flexibility, postural dysfunction, and pain.   ACTIVITY LIMITATIONS: lifting and reach over head  PARTICIPATION LIMITATIONS: workout routine  PERSONAL FACTORS: 1-2 comorbidities: diabetes are also affecting patient's functional outcome.   REHAB POTENTIAL: Good  CLINICAL DECISION MAKING: Evolving/moderate complexity  EVALUATION COMPLEXITY: Moderate  GOALS: Goals reviewed with patient? Yes  SHORT TERM GOALS: Target date: 08/07/24  The patient will be indep with initial HEP Baseline: initiated at eval Goal status: INITIAL  2.  The patient will demonstrate AROM in shoulders for abduction without painful arc between 110-120 degrees.  Baseline:  painful arc Goal status: INITIAL  3.  The patient will demonstrate overhead lifting 10# weights without pain to return to home routine. Baseline:  notes inability to lift overhead during workout routine Goal status: INITIAL  LONG TERM GOALS: Target date:  09/06/24  The patient will be indep with progression of HEP. Baseline:  initiated at eval Goal status: INITIAL  2.  The patient will improve Annitta to 0%  to demonstrate improved functional abilities. Baseline: 6% Goal status: INITIAL  3.  The patient will improve R shoulder abduction strength to 5/5 without pain for improved return to wellness routine. Baseline:  painful MMT R UE Goal status: INITIAL  4.  The patient will return to full workout routine without c/o pain. Baseline:  Modifying due to shoulder pain. Goal status: INITIAL  5.  The patient will report no pain in shoulders.  Baseline: 5/10 Goal status: INITIAL  PLAN: PT FREQUENCY: 1-2x/week  PT DURATION: 8 weeks  PLANNED INTERVENTIONS: 02835- PT Re-evaluation, 97750- Physical Performance Testing, 97110-Therapeutic exercises, 97530- Therapeutic activity, [id number]- Neuromuscular re-education, 97535- Self Care, 02859- Manual therapy, 20560 (1-2 muscles), 20561 (3+ muscles)- Dry Needling, Patient/Family education, Taping, and Joint mobilization  PLAN FOR NEXT SESSION: work on stretching upper back (had rhomboid pain prior to onset of shoulder pain), upper back strengthening, RTC strengthening. Eventually working on return to gym routine.   Alm DELENA Jenny PT, DPT 07/27/2024 4:52 PM

## 2024-07-29 ENCOUNTER — Ambulatory Visit: Admitting: Rehabilitative and Restorative Service Providers"

## 2024-07-29 ENCOUNTER — Ambulatory Visit: Admitting: Family Medicine

## 2024-07-29 DIAGNOSIS — M25511 Pain in right shoulder: Secondary | ICD-10-CM | POA: Diagnosis not present

## 2024-07-29 DIAGNOSIS — M25512 Pain in left shoulder: Secondary | ICD-10-CM

## 2024-07-29 NOTE — Therapy (Signed)
 OUTPATIENT PHYSICAL THERAPY TREATMENT   Patient Name: Robert [name] MRN: [id number] DOB:09-15-1980, 43 y.o., male Today's Date: 07/29/2024  END OF SESSION:  PT End of Session - 07/29/24 1405     Visit Number 5    Number of Visits 16    Date for Recertification  09/06/24    Authorization Type [employer]    PT Start Time 1406    PT Stop Time 1445    PT Time Calculation (min) 39 min    Activity Tolerance Patient tolerated treatment well    Behavior During Therapy [facility] for tasks assessed/performed          Past Medical History:  Diagnosis Date   Fatty liver 01/07/2017   Seen US  12/2016   Hyperlipidemia    Prediabetes 07/07/2017   No past surgical history on file. 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   Smoking 01/27/2018   Type 2 diabetes mellitus without complications (HCC) 07/07/2017   Fatty liver 01/07/2017   Skin tags, multiple acquired 08/20/2016   Multiple nevi 04/09/2016   Back pain 02/06/2016   Left varicocele 02/06/2016    PCP: Velma Ku, DO REFERRING PROVIDER: Velma Ku HAS REFERRING DIAG: [phone] (ICD-10-CM) - Acute pain of both shoulders   THERAPY DIAG:  Acute pain of right shoulder  Acute pain of left shoulder  Rationale for Evaluation and Treatment: Rehabilitation  ONSET DATE: 07/02/24  SUBJECTIVE:                                                                                                                                                                                     Per eval: The patient reports onset of pain 2 -3 months ago beginning in the L shoulder, which he sleeps on. He now has pain bilaterally, which did improve with recent use of meloxicam .  Hand dominance: Right  SUBJECTIVE STATEMENT: The patient  feels pain is significantly better.  His MD appt from today got moved to 12/4.   PERTINENT HISTORY: Diabetes mellitus, back pain  PAIN:  Are you having pain? 07/29/2024 none at present, no more than 1-2/10 over past couple days   Per eval: Yes: NPRS scale: 5/10 Pain location: bilateral shoulders in posterior/superior aspect Pain description: arc of pain around 90 degrees with limited motion Aggravating factors: movement, overhead movement Relieving factors: rest  PRECAUTIONS: None  WEIGHT BEARING RESTRICTIONS: No  FALLS:  Has patient fallen in last 6 months? 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  DIAGNOSTIC FINDINGS:  Xrays reviews in epic  PATIENT SURVEYS :  Quick Dash=6.8%  COGNITION: Overall cognitive status: Within functional limits for tasks assessed     SENSATION: WFL  POSTURE: WFLs  UPPER EXTREMITY ROM:  Active ROM Right eval Left eval R/L 07/22/24  Shoulder flexion 148 158 152/ 155 painless  Shoulder extension     Shoulder abduction 120 gets pianful arc, but can go to 152 110 gets pain, but can go to 150 deg 160 / 145 deg painless  Shoulder adduction     Shoulder internal rotation     Shoulder external rotation 75 82   Elbow flexion     Elbow extension     Wrist flexion     Wrist extension     Wrist ulnar deviation     Wrist radial deviation     Wrist pronation     Wrist supination     (Blank rows = not tested)  UPPER EXTREMITY MMT: MMT Right eval Left eval  Shoulder flexion 5/5 5/5  Shoulder extension    Shoulder abduction 3+/5 with pain 4/5  Shoulder adduction    Shoulder internal rotation At neutral 5/5 At neutral 5/5  Shoulder external rotation  At neutral 5/5 At neutral 5/5  Middle trapezius    Lower trapezius    Elbow flexion    Elbow extension    Wrist flexion    Wrist extension    Wrist ulnar deviation    Wrist radial deviation    Wrist pronation    Wrist supination    Grip strength (lbs)    (Blank rows = not tested)  SHOULDER SPECIAL TESTS: Impingement tests: Painful arc test: positive  otator cuff assessment: Empty can test: positive  *most provoking movements Biceps assessment: Yergason's test: negative and Speed's test: negative  JOINT MOBILITY TESTING:  Hypomobility and crepitus with movement  PALPATION:  Tender over infraspinatus bilaterally                       [facility] Adult PT Treatment:                                                DATE: 07/29/24  Therapeutic Exercise: Matrix Bent over low row (lunge)15# x 10 reps R and L Mid range row 15# x 10 reps R and L  High>low row with cues on body position Bent over rows 15# KB x 10 reps R and L Lat stretch wall Lat stretch on knees (padded foam) and chest opening with Ues on mat Ulnar nerve stretch (trialed 3 glides/flossing) Neuromuscular re-ed: Quadriped cat/camel x 5 reps with cues  Quadriped scapular protraction/retraction 5# KB bottoms up carry 2 laps each UE 15# bottoms up KB OH press bilaterally x 10 reps Therapeutic Activity: Plank--extended Standing wall lean with clock reach-- does get some R scapular winging-- cues for serratus engagement push into wall Thread the needle  [facility] Adult PT Treatment:                                                DATE: 07/27/24 Therapeutic Exercise: DB three way raise 5# x8 BIL cues for comfortable ROM  CC 15# CC high>low row x10 BIL, 20# ER iso hold 5# CC; 2x30sec BIL in neutral position  HEP update + handout/education  Neuromuscular re-ed: 5# KB bottoms up carry 1 lap each UE, 10# 1 lap each  10# bottoms  up KB OH press BIL  Serratus wall push w/ foam roller x15  Quadruped scapular protraction 2x10, cues for hand positioning  GB W 2x10    [facility] Adult PT Treatment:                                                DATE: 07/22/24 Therapeutic Exercise: CC row 15# single arm; x8 BIL cues for form CC high>low row 15# single arm; x8 BIL cues for form  CC lawnmower row 15# single arm; x8 BIL cues for form  CC mid row (shoulder 90 deg abd) 10# x8 BIL cues for form  Cat/camel x8 cues for comfortable ROM  DB three way raise (flex/scap/abd) 3# BIL x8 total; cues for shoulder positioning HEP update + education/discussion  Neuromuscular re-ed: Serratus foam roll push up wall x15 Serratus foam roll push + YB at wrists x10  YB ER fly; seated; 2x10  5# KB bottoms up carry 2 laps BIL     [facility] Adult PT Treatment:                                                DATE: 07/15/24 Therapeutic Exercise: Supine shoulder flexion AROM x12  Sidelying 3# ER 3-3-3 tempo x8 BIL  Sidelying unweighted shoulder flexion 3-3-3 tempo x8 BIL Sidelying horizontal abd 3# x8 3-3-3 tempo BIL  Standing T spine ext w/ foam roll at wall x10 Seated T spine ext w/ foam roll at wall x10 (improved low back tolerance) HEP education/discussion   Neuromuscular re-ed: Serratus push up wall 2x12  Seated YB scaption <90 deg 2x8 cues for posture and control   PATIENT EDUCATION: Education details: HEP, plan of care Person educated: Patient Education method: Explanation, Demonstration, and Handouts Education comprehension: verbalized understanding, returned demonstration, and needs further education  HOME EXERCISE PROGRAM: Access Code: [email or web address] URL: [email or web address] Date: 07/29/2024 Prepared by: Tawni Ferrier  Exercises - Sidelying Shoulder ER with Towel and Dumbbell  - 1 x daily - 5 x weekly - 2 sets - 15 reps - Sidelying Shoulder Abduction Palm Forward  - 1 x daily - 5 x weekly - 2 sets - 15 reps -  Sidelying Shoulder Horizontal Abduction  - 1 x daily - 5 x weekly - 2 sets - 15 reps - Standing Shoulder Row with Anchored Resistance  - 1 x daily - 5 x weekly - 2 sets - 10-12 reps - Half Kneel Row W  - 1 x daily - 5 x weekly - 2-3 sets - 6-8 reps - Serratus Activation at Wall with Foam Roll and Resistance  Band  - 1 x daily - 5 x weekly - 2-3 sets - 6-8 reps - Ulnar Nerve Flossing  - 2 x daily - 7 x weekly - 1 sets - 10 reps  ASSESSMENT:  CLINICAL IMPRESSION: The patient is making progress towards goals with no pain bilat UE with AROM and resistance training today. He has not been compliant with HEP this week and we discussed need to continue to build strength. Patient has a plan to return to the gym when it is done being renovated (late November). PT scheduled for 2 more weeks-- plan to check goals early and d/c if continuing to progress.    Per eval: Patient is a 43 y.o. male who was seen today for physical therapy evaluation and treatment for bilateral shoulder pain that began insidiously approximately 2 months ago. He presents with painful arc ROM bilaterally, muscle weakness in bilat shoulders, mild decrease in AROM, tenderness to palpation bilat infraspinatus, and decreased ability to participate in wellness routine. PT to address deficits in order to reduce pain and return to prior functional status.   OBJECTIVE IMPAIRMENTS: decreased activity tolerance, decreased ROM, decreased strength, hypomobility, impaired flexibility, postural dysfunction, and pain.   GOALS: Goals reviewed with patient? Yes  SHORT TERM GOALS: Target date: 08/07/24  The patient will be indep with initial HEP Baseline: initiated at eval Goal status: INITIAL  2.  The patient will demonstrate AROM in shoulders for abduction without painful arc between 110-120 degrees.  Baseline:  painful arc Goal status: MET on 07/29/24  3.  The patient will demonstrate overhead lifting 10# weights without pain to return to  home routine. Baseline:  notes inability to lift overhead during workout routine Goal status: INITIAL  LONG TERM GOALS: Target date: 09/06/24  The patient will be indep with progression of HEP. Baseline:  initiated at eval Goal status: INITIAL  2.  The patient will improve Quick dash to 0%  to demonstrate improved functional abilities. Baseline: 6% Goal status: INITIAL  3.  The patient will improve R shoulder abduction strength to 5/5 without pain for improved return to wellness routine. Baseline:  painful MMT R UE Goal status: INITIAL  4.  The patient will return to full workout routine without c/o pain. Baseline:  Modifying due to shoulder pain. Goal status: INITIAL  5.  The patient will report no pain in shoulders.  Baseline: 5/10 Goal status: MET on 07/29/24  PLAN: PT FREQUENCY: 1-2x/week  PT DURATION: 8 weeks  PLANNED INTERVENTIONS: 97164- PT Re-evaluation, 97750- Physical Performance Testing, 97110-Therapeutic exercises, 97530- Therapeutic activity, 97112- Neuromuscular re-education, 97535- Self Care, 02859- Manual therapy, 20560 (1-2 muscles), 20561 (3+ muscles)- Dry Needling, Patient/Family education, Taping, and Joint mobilization  PLAN FOR NEXT SESSION: upper back strengthening, RTC strengthening. Return to gym routine.   Robert [name], PT 07/29/2024 2:07 PM

## 2024-08-10 ENCOUNTER — Encounter: Payer: Self-pay | Admitting: Physical Therapy

## 2024-08-10 ENCOUNTER — Ambulatory Visit: Admitting: Physical Therapy

## 2024-08-10 DIAGNOSIS — M25512 Pain in left shoulder: Secondary | ICD-10-CM | POA: Insufficient documentation

## 2024-08-10 DIAGNOSIS — M25511 Pain in right shoulder: Secondary | ICD-10-CM | POA: Diagnosis present

## 2024-08-10 NOTE — Therapy (Signed)
 OUTPATIENT PHYSICAL THERAPY TREATMENT   Patient Name: Robert [name] MRN: [id number] DOB:09-02-1981, 43 y.o., male Today's Date: 08/10/2024  END OF SESSION:  PT End of Session - 08/10/24 1616     Visit Number 6    Number of Visits 16    Date for Recertification  09/06/24    Authorization Type [employer]    PT Start Time 1616    PT Stop Time 1656    PT Time Calculation (min) 40 min           Past Medical History:  Diagnosis Date   Fatty liver 01/07/2017   Seen US  12/2016   Hyperlipidemia    Prediabetes 07/07/2017   History reviewed. No pertinent surgical history. 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   Smoking 01/27/2018   Type 2 diabetes mellitus without complications (HCC) 07/07/2017   Fatty liver 01/07/2017   Skin tags, multiple acquired 08/20/2016   Multiple nevi 04/09/2016   Back pain 02/06/2016   Left varicocele 02/06/2016    PCP: Velma Ku, DO REFERRING PROVIDER: Velma Ku HAS REFERRING DIAG: [phone] (ICD-10-CM) - Acute pain of both shoulders   THERAPY DIAG:  Acute pain of right shoulder  Acute pain of left shoulder  Rationale for Evaluation and Treatment: Rehabilitation  ONSET DATE: 07/02/24  SUBJECTIVE:                                                                                                                                                                                     Per eval: The patient reports onset of pain 2 -3 months ago beginning in the L shoulder, which he sleeps on. He now has pain bilaterally, which did improve with recent use of meloxicam .  Hand dominance: Right  SUBJECTIVE STATEMENT: 08/10/2024: still hasn't been needing medication. Shoulders still feeling pretty good. Hasn't been able to do  exercises quite as much due to travelling.     PERTINENT HISTORY: Diabetes mellitus, back pain  PAIN:  Are you having pain? 08/10/2024 none at present, no more than 2-3/10 over past week  Per eval: Yes: NPRS scale: 5/10 Pain location: bilateral shoulders in posterior/superior aspect Pain description: arc of pain around 90 degrees with limited motion Aggravating factors: movement, overhead movement Relieving factors: rest  PRECAUTIONS: None  WEIGHT BEARING RESTRICTIONS: No  FALLS:  Has patient fallen in last 6 months? 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  DIAGNOSTIC FINDINGS:  Xrays reviews in epic  PATIENT SURVEYS :  Quick Dash=6.8%  COGNITION: Overall cognitive status: Within functional limits for tasks assessed     SENSATION: WFL  POSTURE: WFLs  UPPER EXTREMITY ROM:  Active ROM Right eval Left eval R/L 07/22/24  Shoulder flexion 148 158 152/ 155 painless  Shoulder extension     Shoulder abduction 120 gets pianful arc, but can go to 152 110 gets pain, but can go to 150 deg 160 / 145 deg painless  Shoulder adduction     Shoulder internal rotation     Shoulder external rotation 75 82   Elbow flexion     Elbow extension     Wrist flexion     Wrist extension     Wrist ulnar deviation     Wrist radial deviation     Wrist pronation     Wrist supination     (Blank rows = not tested)  UPPER EXTREMITY MMT: MMT Right eval Left eval  Shoulder flexion 5/5 5/5  Shoulder extension    Shoulder abduction 3+/5 with pain 4/5  Shoulder adduction    Shoulder internal rotation At neutral 5/5 At neutral 5/5  Shoulder external rotation At neutral 5/5 At neutral 5/5  Middle  trapezius    Lower trapezius    Elbow flexion    Elbow extension    Wrist flexion    Wrist extension    Wrist ulnar deviation    Wrist radial deviation    Wrist pronation    Wrist supination    Grip strength (lbs)    (Blank rows = not tested)  SHOULDER SPECIAL TESTS: Impingement tests: Painful arc test: positive  otator cuff assessment: Empty can test: positive  *most provoking movements Biceps assessment: Yergason's test: negative and Speed's test: negative  JOINT MOBILITY TESTING:  Hypomobility and crepitus with movement  PALPATION:  Tender over infraspinatus bilaterally                        [facility] Adult PT Treatment:                                                DATE: 08/10/24 Therapeutic Exercise: Cable column lawnmower row x15# x8 BIL, 20# x8  CC high>low row staggered stance 15# x8, 20# CC serratus press 5# 2x10 BIL emphasis on mechanics and setup  HEP discussion/education, discussed gym program, basic principles of progression/regression  Neuromuscular re-ed: Bear plank 2x30sec  Wall clock red band 2x5 laps BIL     [facility] Adult PT Treatment:                                                DATE: 07/29/24  Therapeutic Exercise: Matrix Bent over low row (lunge)15# x 10 reps R and L Mid range row 15# x 10 reps R and L  High>low row with cues on body position Bent over rows 15# KB x 10 reps R and L Lat stretch wall Lat stretch on knees (  padded foam) and chest opening with Ues on mat Ulnar nerve stretch (trialed 3 glides/flossing) Neuromuscular re-ed: Quadriped cat/camel x 5 reps with cues  Quadriped scapular protraction/retraction 5# KB bottoms up carry 2 laps each UE 15# bottoms up KB OH press bilaterally x 10 reps Therapeutic Activity: Plank--extended Standing wall lean with clock reach-- does get some R scapular winging-- cues for serratus engagement push into wall Thread the needle                                                                                                         [facility] Adult PT Treatment:                                                DATE: 07/27/24 Therapeutic Exercise: DB three way raise 5# x8 BIL cues for comfortable ROM  CC 15# CC high>low row x10 BIL, 20# ER iso hold 5# CC; 2x30sec BIL in neutral position  HEP update + handout/education  Neuromuscular re-ed: 5# KB bottoms up carry 1 lap each UE, 10# 1 lap each  10# bottoms up KB OH press BIL  Serratus wall push w/ foam roller x15  Quadruped scapular protraction 2x10, cues for hand positioning  GB W 2x10    [facility] Adult PT Treatment:                                                DATE: 07/22/24 Therapeutic Exercise: CC row 15# single arm; x8 BIL cues for form CC high>low row 15# single arm; x8 BIL cues for form  CC lawnmower row 15# single arm; x8 BIL cues for form  CC mid row (shoulder 90 deg abd) 10# x8 BIL cues for form  Cat/camel x8 cues for comfortable ROM  DB three way raise (flex/scap/abd) 3# BIL x8 total; cues for shoulder positioning HEP update + education/discussion  Neuromuscular re-ed: Serratus foam roll push up wall x15 Serratus foam roll push + YB at wrists x10  YB ER fly; seated; 2x10  5# KB bottoms up carry 2 laps BIL     [facility] Adult PT Treatment:                                                DATE: 07/15/24 Therapeutic Exercise: Supine shoulder flexion AROM x12  Sidelying 3# ER 3-3-3 tempo x8 BIL  Sidelying unweighted shoulder flexion 3-3-3 tempo x8 BIL Sidelying horizontal abd 3# x8 3-3-3 tempo BIL  Standing T spine ext w/ foam roll at wall x10 Seated T spine ext w/ foam roll at wall x10 (improved low back tolerance) HEP education/discussion   Neuromuscular re-ed: Serratus push up wall 2x12  Seated YB scaption <90 deg 2x8 cues for posture and control   PATIENT EDUCATION: Education details: HEP, plan of care Person educated: Patient Education method: Explanation, Demonstration, and Handouts Education comprehension: verbalized  understanding, returned demonstration, and needs further education  HOME EXERCISE PROGRAM: Access Code: [email or web address] URL: [email or web address] Date: 07/29/2024 Prepared by: Tawni Ferrier  Exercises - Sidelying Shoulder ER with Towel and Dumbbell  - 1 x daily - 5 x weekly - 2 sets - 15 reps - Sidelying Shoulder Abduction Palm Forward  - 1 x daily - 5 x weekly - 2 sets - 15 reps - Sidelying Shoulder Horizontal Abduction  - 1 x daily - 5 x weekly - 2 sets - 15 reps - Standing Shoulder Row with Anchored Resistance  - 1 x daily - 5 x weekly - 2 sets - 10-12 reps - Half Kneel Row W  - 1 x daily - 5 x weekly - 2-3 sets - 6-8 reps - Serratus Activation at Wall with Foam Roll and Resistance Band  - 1 x daily - 5 x weekly - 2-3 sets - 6-8 reps - Ulnar Nerve Flossing  - 2 x daily - 7 x weekly - 1 sets - 10 reps  ASSESSMENT:  CLINICAL IMPRESSION: 08/10/2024: Pt arrives w/ report of continued improvement in pain, follows with referring provider Thursday AM. Today we continue working on gym program, maximizing self efficacy with managing of rowing/pressing variations. Increased time with activities to mitigate compensations and ensure appropriate performance. Does endorse muscular fatigue as session goes on but no increase in pain, no adverse events. Recommend continuing along current POC in order to address relevant deficits and improve functional tolerance.  Pt departs today's session in no acute distress, all voiced questions/concerns addressed appropriately from PT perspective.     Per eval: Patient is a 43 y.o. male who was seen today for physical therapy evaluation and treatment for bilateral shoulder pain that began insidiously approximately 2 months ago. He presents with painful arc ROM bilaterally, muscle weakness in bilat shoulders, mild decrease in AROM, tenderness to palpation bilat infraspinatus, and decreased ability to participate in wellness routine. PT to address deficits in  order to reduce pain and return to prior functional status.   OBJECTIVE IMPAIRMENTS: decreased activity tolerance, decreased ROM, decreased strength, hypomobility, impaired flexibility, postural dysfunction, and pain.   GOALS: Goals reviewed with patient? Yes  SHORT TERM GOALS: Target date: 08/07/24  The patient will be indep with initial HEP Baseline: initiated at eval Goal status: INITIAL  2.  The patient will demonstrate AROM in shoulders for abduction without painful arc between 110-120 degrees.  Baseline:  painful arc Goal status: MET on 07/29/24  3.  The patient will demonstrate overhead lifting 10# weights without pain to return to home routine. Baseline:  notes inability to lift overhead during workout routine Goal status: INITIAL  LONG TERM GOALS: Target date: 09/06/24  The patient will be indep with progression of HEP. Baseline:  initiated at eval Goal status: INITIAL  2.  The patient will improve Quick dash to 0%  to demonstrate improved functional abilities. Baseline: 6% Goal status: INITIAL  3.  The patient will improve R shoulder abduction strength to 5/5 without pain for improved return to wellness routine. Baseline:  painful MMT R UE Goal status: INITIAL  4.  The patient will return to full workout routine without c/o pain. Baseline:  Modifying due to shoulder pain. Goal status: INITIAL  5.  The patient will report no pain  in shoulders.  Baseline: 5/10 Goal status: MET on 07/29/24  PLAN: PT FREQUENCY: 1-2x/week  PT DURATION: 8 weeks  PLANNED INTERVENTIONS: 97164- PT Re-evaluation, 97750- Physical Performance Testing, 97110-Therapeutic exercises, 97530- Therapeutic activity, 97112- Neuromuscular re-education, 97535- Self Care, 02859- Manual therapy, 20560 (1-2 muscles), 20561 (3+ muscles)- Dry Needling, Patient/Family education, Taping, and Joint mobilization  PLAN FOR NEXT SESSION: upper back strengthening, RTC strengthening. Return to gym  routine.   Alm DELENA Jenny PT, DPT 08/10/2024 4:59 PM

## 2024-08-12 ENCOUNTER — Ambulatory Visit (INDEPENDENT_AMBULATORY_CARE_PROVIDER_SITE_OTHER): Admitting: Family Medicine

## 2024-08-12 ENCOUNTER — Ambulatory Visit: Admitting: Physical Therapy

## 2024-08-12 ENCOUNTER — Encounter: Payer: Self-pay | Admitting: Family Medicine

## 2024-08-12 ENCOUNTER — Encounter: Payer: Self-pay | Admitting: Physical Therapy

## 2024-08-12 VITALS — BP 129/79 | HR 72 | Ht 77.0 in | Wt 236.0 lb

## 2024-08-12 DIAGNOSIS — M25511 Pain in right shoulder: Secondary | ICD-10-CM

## 2024-08-12 DIAGNOSIS — M25512 Pain in left shoulder: Secondary | ICD-10-CM

## 2024-08-12 MED ORDER — MELOXICAM 15 MG PO TABS: 15.0000 mg | ORAL_TABLET | Freq: Every day | ORAL | 0 refills | Status: DC | PRN

## 2024-08-12 NOTE — Therapy (Signed)
 OUTPATIENT PHYSICAL THERAPY TREATMENT + DISCHARGE   Patient Name: Robert [name] MRN: [id number] DOB:04-24-1981, 43 y.o., male Today's Date: 08/12/2024  PHYSICAL THERAPY DISCHARGE SUMMARY  Visits from Start of Care: 7  Current functional level related to goals / functional outcomes: Able to perform usual daily/recreational tasks without reported limitation   Remaining deficits: Pain, stiffness   Education / Equipment: HEP, discharge education, gradual progression of activity, follow up with provider as indicated   Patient agrees to discharge. Patient goals were partially met. Patient is being discharged due to being pleased with the current functional level.   END OF SESSION:  PT End of Session - 08/12/24 1406     Visit Number 7    Number of Visits 16    Date for Recertification  09/06/24    Authorization Type [employer]    PT Start Time 1406    PT Stop Time 1434    PT Time Calculation (min) 28 min            Past Medical History:  Diagnosis Date   Fatty liver 01/07/2017   Seen US  12/2016   Hyperlipidemia    Prediabetes 07/07/2017   History reviewed. No pertinent surgical history. 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   Smoking 01/27/2018   Type 2 diabetes mellitus without complications (HCC) 07/07/2017   Fatty liver 01/07/2017   Skin tags, multiple acquired 08/20/2016   Multiple nevi 04/09/2016   Back pain 02/06/2016   Left varicocele 02/06/2016    PCP: Velma Ku, DO REFERRING PROVIDER: Velma Ku HAS REFERRING DIAG: [phone] (ICD-10-CM) - Acute pain of both shoulders   THERAPY DIAG:  Acute pain of right shoulder  Acute pain of left shoulder  Rationale for Evaluation and Treatment: Rehabilitation  ONSET DATE:  07/02/24  SUBJECTIVE:                                                                                                                                                                                     Per eval: The patient reports onset of pain 2 -3 months ago beginning in the L shoulder, which he sleeps on. He now has pain bilaterally, which did improve with recent use of meloxicam .  Hand dominance: Right  SUBJECTIVE STATEMENT: 08/12/2024: followed w/ PCP this morning, states visit went well. Went to gym yesterday, did some heavy lifting (5x5); states he had to reduce the weight but tolerated well overall. Felt some stiffness but no  pain in shoulders. States he feels ready to discharge today.   PERTINENT HISTORY: Diabetes mellitus, back pain  PAIN:  Are you having pain? 08/12/2024 none at present, no more than 3/10 in past week  Per eval: Yes: NPRS scale: 5/10 Pain location: bilateral shoulders in posterior/superior aspect Pain description: arc of pain around 90 degrees with limited motion Aggravating factors: movement, overhead movement Relieving factors: rest  PRECAUTIONS: None  WEIGHT BEARING RESTRICTIONS: No  FALLS:  Has patient fallen in last 6 months? 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  DIAGNOSTIC FINDINGS:  Xrays reviews in epic  PATIENT SURVEYS :  Quick Dash=6.8% Quickdash 08/12/24 6.8%   COGNITION: Overall cognitive status: Within functional limits for tasks assessed     SENSATION: WFL  POSTURE: WFLs  UPPER EXTREMITY ROM:  Active ROM Right eval Left eval R/L 07/22/24 R/L 08/12/24   Shoulder flexion 148 158 152/ 155 painless 155/160  Shoulder extension       Shoulder abduction 120 gets pianful arc, but can go to 152 110 gets pain, but can go to 150 deg 160 / 145 deg painless 150/155  Shoulder adduction      Shoulder internal rotation      Shoulder external rotation 75 82    Elbow flexion      Elbow extension      Wrist flexion      Wrist extension      Wrist ulnar deviation      Wrist radial deviation      Wrist pronation      Wrist supination      (Blank rows = not tested)  UPPER EXTREMITY MMT: MMT Right eval Left eval R/L 08/12/24  Shoulder flexion 5/5 5/5 5/5   Shoulder extension     Shoulder abduction 3+/5 with pain 4/5 5/5  Shoulder adduction     Shoulder internal rotation At neutral 5/5 At neutral 5/5 5/5 elevated  Shoulder external rotation At neutral 5/5 At neutral 5/5 5/5 elevated 90   Middle trapezius     Lower trapezius     Elbow flexion     Elbow extension     Wrist flexion     Wrist extension     Wrist ulnar deviation     Wrist radial deviation     Wrist pronation     Wrist supination     Grip strength (lbs)     (Blank rows = not tested)  SHOULDER SPECIAL TESTS: Impingement tests: Painful arc test: positive  otator cuff assessment: Empty can test: positive  *most provoking movements Biceps assessment: Yergason's test: negative and Speed's test: negative  JOINT MOBILITY TESTING:  Hypomobility and crepitus with movement  PALPATION:  Tender over infraspinatus bilaterally                        [facility] Adult PT Treatment:                                                DATE: 08/12/24 Therapeutic Exercise: - verbal HEP review + handout, pt politely declines performance in clinic  Therapeutic Activity: MSK assessment + education OH lifting assessment: 5# for >20 repetitions total, variable positioning (neutral, scapular plane, abducted); pain with scapular and abducted, no issues w/ neutral. 10# nonpainful in all positions  Education/discussion re: progress with PT, symptom behavior as it affects activity  tolerance, PT goals/POC, discharge education, follow up with provider, gradual progression of activities with respect to symptom tolerance  PATIENT EDUCATION: Education details: PT POC, PT goals, progress with PT thus far, discharge planning, HEP, follow up with provider as indicated Person educated: Patient Education method: Explanation, Demonstration, Verbal cues Education comprehension: verbalized understanding, returned demonstration   HOME EXERCISE PROGRAM: Access Code: [email or web address] URL: [email or web address] Date: 08/12/2024 Prepared by: Alm Jenny  Exercises - Sidelying Shoulder ER with Towel and Dumbbell  - 3-4 x weekly - 2-3 sets - 10-15 reps - Sidelying Shoulder Abduction Palm Forward  - 3-4 x weekly - 2-3 sets - 10-15 reps - Sidelying Shoulder Horizontal Abduction  - 3-4 x weekly - 2-3 sets - 10-15 reps - Standing Shoulder Row with Anchored Resistance  - 3-4 x weekly - 2-3 sets - 10-12 reps - Half Kneel Row W  - 3-4 x weekly - 2-3 sets - 8-12 reps - Serratus Activation at Wall with Foam Roll and Resistance Band  - 3-4 x weekly - 2-3 sets - 6-8 reps - Bear Plank from [employer]  - 3-4 x weekly - 1 sets - 2-3 reps - 20-30sec hold - Ulnar Nerve Flossing  - 2-3 sets - 10 reps  ASSESSMENT:  CLINICAL IMPRESSION: 08/12/2024: Pt arrives w/o pain, reports return to gym activities without issue and feels confident discharging from PT today. On exam he demonstrates some mild stiffness R shoulder compared to L, but overall strength and ROM much improved and no longer provocative. He does have some symptom provocation with overhead lifting (see above), although noted improvement in tolerance w/ 10# compared to 5#. Discussed options w/ PT going forward given continued pain with overhead pressing; after discussion pt elects to discharge at this time, education is provided on gradual progressions with modifications based on symptom behavior, follow up with provider as indicated.  Discharge education is provided. He verbalizes agreement/understanding w/ plan at this time. No adverse events, tolerates assessment well and politely declines HEP practice in clinic today. Pt departs today's session in no acute distress, all voiced questions/concerns addressed appropriately from PT perspective.     Per eval: Patient is a 43 y.o. male who was seen today for physical therapy evaluation and treatment for bilateral shoulder pain that began insidiously approximately 2 months ago. He presents with painful arc ROM bilaterally, muscle weakness in bilat shoulders, mild decrease in AROM, tenderness to palpation bilat infraspinatus, and decreased ability to participate in wellness routine. PT to address deficits in order to reduce pain and return to prior functional status.   OBJECTIVE IMPAIRMENTS: decreased activity tolerance, decreased ROM, decreased strength, hypomobility, impaired flexibility, postural dysfunction, and pain.   GOALS: Goals reviewed with patient? Yes  SHORT TERM GOALS: Target date: 08/07/24  The patient will be indep with initial HEP Baseline: initiated at eval 08/12/24: reports independence w/ HEP Goal status: MET  2.  The patient will demonstrate AROM in shoulders for abduction without painful arc between 110-120 degrees.  Baseline:  painful arc Goal status: MET on 07/29/24  3.  The patient will demonstrate overhead lifting 10# weights without pain to return to home routine. Baseline:  notes inability to lift overhead during workout routine 08/12/24: 5# OH with  pain when in scapular plane and abduction, none with neutral; able to do 10# in all positions without pain Goal status: PARTIALLY MET  LONG TERM GOALS: Target date: 09/06/24  The patient will be indep with progression of HEP. Baseline:  initiated at eval 08/12/24: independent HEP  Goal status: MET  2.  The patient will improve Quick dash to 0%  to demonstrate improved functional abilities. Baseline:  6% 08/12/24: 6% Goal status: NOT MET  3.  The patient will improve R shoulder abduction strength to 5/5 without pain for improved return to wellness routine. Baseline:  painful MMT R UE 08/12/24: no pain w/ MMT, WNL Goal status: MET  4.  The patient will return to full workout routine without c/o pain. Baseline:  Modifying due to shoulder pain. 08/12/24: full gym routine yesterday w/o pain Goal status: MET  5.  The patient will report no pain in shoulders.  Baseline: 5/10 Goal status: MET on 07/29/24  PLAN: (DISCHARGE 08/12/24)    Alm DELENA Jenny PT, DPT 08/12/2024 5:17 PM

## 2024-08-12 NOTE — Progress Notes (Signed)
 Robert [name] - 43 y.o. male MRN [id number]  Date of birth: [birth year]  Subjective No chief complaint on file.   HPI Robert [name] is a 43 y.o. male here today for follow up visit.   He was having shoulder pain.  Meloxicam  added and referred to PT at last visit.  Reports that this has improved significantly.  He has full range of motion in both shoulders and no pain at this time.  He has resumed gym workouts.   ROS:  A comprehensive ROS was completed and negative except as noted per HPI  No Known Allergies  Past Medical History:  Diagnosis Date   Fatty liver 01/07/2017   Seen US  12/2016   Hyperlipidemia    Prediabetes 07/07/2017    No past surgical history on file.  Social History   Socioeconomic History   Marital status: Married    Spouse name: Not on file   Number of children: Not on file   Years of education: Not on file   Highest education level: Bachelor's degree (e.g., BA, AB, BS)  Occupational History   Not on file  Tobacco Use   Smoking status: Some Days    Current packs/day: 0.50    Average packs/day: 0.5 packs/day for 10.0 years (5.0 ttl pk-yrs)    Types: Cigarettes   Smokeless tobacco: Never   Tobacco comments:    using chantix   Vaping Use   Vaping status: Never Used  Substance and Sexual Activity   Alcohol use: Yes    Alcohol/week: 2.0 standard drinks of alcohol    Types: 2 Standard drinks or equivalent per week   Drug use: No   Sexual activity: Not on file  Other Topics Concern   Not on file  Social History Narrative   Not on file   Social Drivers of Health   Financial Resource Strain: Low Risk  (04/01/2023)   Overall Financial Resource Strain (CARDIA)    Difficulty of Paying Living Expenses: Not hard at all  Food Insecurity: No Food Insecurity (04/01/2023)   Hunger Vital Sign    Worried About Running Out of Food in the Last Year: Never true    Ran Out of Food in the Last Year: Never true  Transportation Needs: No Transportation Needs  (04/01/2023)   PRAPARE - Administrator, Civil Service (Medical): No    Lack of Transportation (Non-Medical): No  Physical Activity: Sufficiently Active (04/01/2023)   Exercise Vital Sign    Days of Exercise per Week: 3 days    Minutes of Exercise per Session: 50 min  Stress: No Stress Concern Present (04/01/2023)   [employer] of Occupational Health - Occupational Stress Questionnaire    Feeling of Stress : Only a little  Social Connections: Socially Isolated (04/01/2023)   Social Connection and Isolation Panel    Frequency of Communication with Friends and Family: Once a week    Frequency of Social Gatherings with Friends and Family: Once a week    Attends Religious Services: Never    Database Administrator or Organizations: No    Attends Engineer, Structural: Not on file    Marital Status: Married    Family History  Problem Relation Age of Onset   Cancer Father        prostate   Healthy Mother     Health Maintenance  Topic Date Due   FOOT EXAM  Never done   OPHTHALMOLOGY EXAM  Never done   Hepatitis C  Screening  Never done   Pneumococcal Vaccine (1 of 2 - PCV) Never done   HPV VACCINES (1 - Risk 3-dose SCDM series) Never done   DTaP/Tdap/Td (2 - Td or Tdap) 09/09/2020   Diabetic kidney evaluation - eGFR measurement  12/16/2020   Colonoscopy  Never done   Influenza Vaccine  12/07/2024 (Originally 04/09/2024)   COVID-19 Vaccine (3 - Moderna risk series) 07/22/2025 (Originally 12/15/2019)   HEMOGLOBIN A1C  09/26/2024   Diabetic kidney evaluation - Urine ACR  06/17/2025   Hepatitis B Vaccines 19-59 Average Risk  Completed   HIV Screening  Completed   Meningococcal B Vaccine  Aged Out     ----------------------------------------------------------------------------------------------------------------------------------------------------------------------------------------------------------------- Physical Exam BP 129/79   Pulse 72   Ht 6' 5 (1.956 m)    Wt 236 lb (107 kg)   SpO2 99%   BMI 27.99 kg/m   Physical Exam Constitutional:      Appearance: Normal appearance.  HENT:     Head: Normocephalic and atraumatic.  Pulmonary:     Breath sounds: Normal breath sounds.  Musculoskeletal:     Cervical back: Neck supple.  Neurological:     General: No focal deficit present.     Mental Status: He is alert.  Psychiatric:        Mood and Affect: Mood normal.        Behavior: Behavior normal.     ------------------------------------------------------------------------------------------------------------------------------------------------------------------------------------------------------------------- Assessment and Plan  Acute pain of both shoulders Pretty much resolved with meloxicam  and PT.  He will continue home exercises.  Meloxicam  as needed   Meds ordered this encounter  Medications   meloxicam  (MOBIC ) 15 MG tablet    Sig: Take 1 tablet (15 mg total) by mouth daily as needed.    Dispense:  30 tablet    Refill:  0    No follow-ups on file.

## 2024-08-12 NOTE — Assessment & Plan Note (Signed)
 Pretty much resolved with meloxicam  and PT.  He will continue home exercises.  Meloxicam  as needed

## 2024-08-17 ENCOUNTER — Ambulatory Visit: Admitting: Physical Therapy

## 2024-08-19 ENCOUNTER — Ambulatory Visit: Admitting: Rehabilitative and Restorative Service Providers"

## 2024-09-27 ENCOUNTER — Other Ambulatory Visit: Payer: Self-pay | Admitting: Family Medicine
